# Patient Record
Sex: Female | Born: 1985 | Race: White | Hispanic: Yes | Marital: Married | Smoking: Never smoker
Health system: Southern US, Community
[De-identification: ages and names within clinical notes are randomized; demographics above are authoritative.]

## PROBLEM LIST (undated history)

## (undated) ENCOUNTER — Inpatient Hospital Stay (HOSPITAL_COMMUNITY): Payer: Self-pay

## (undated) DIAGNOSIS — D219 Benign neoplasm of connective and other soft tissue, unspecified: Secondary | ICD-10-CM

## (undated) DIAGNOSIS — Z789 Other specified health status: Secondary | ICD-10-CM

---
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
## 2004-09-15 ENCOUNTER — Emergency Department (HOSPITAL_COMMUNITY): Admission: EM | Admit: 2004-09-15 | Discharge: 2004-09-16 | Payer: Self-pay | Admitting: Emergency Medicine

## 2004-11-09 ENCOUNTER — Ambulatory Visit (HOSPITAL_COMMUNITY): Admission: RE | Admit: 2004-11-09 | Discharge: 2004-11-09 | Payer: Self-pay | Admitting: *Deleted

## 2005-02-15 ENCOUNTER — Ambulatory Visit: Payer: Self-pay | Admitting: *Deleted

## 2005-02-16 ENCOUNTER — Inpatient Hospital Stay (HOSPITAL_COMMUNITY): Admission: RE | Admit: 2005-02-16 | Discharge: 2005-02-20 | Payer: Self-pay | Admitting: Obstetrics & Gynecology

## 2005-02-16 ENCOUNTER — Encounter (INDEPENDENT_AMBULATORY_CARE_PROVIDER_SITE_OTHER): Payer: Self-pay | Admitting: *Deleted

## 2005-02-16 ENCOUNTER — Ambulatory Visit: Payer: Self-pay | Admitting: Certified Nurse Midwife

## 2007-08-08 ENCOUNTER — Ambulatory Visit: Payer: Self-pay | Admitting: Internal Medicine

## 2007-08-11 ENCOUNTER — Ambulatory Visit: Payer: Self-pay | Admitting: Internal Medicine

## 2007-08-12 ENCOUNTER — Ambulatory Visit: Payer: Self-pay | Admitting: Internal Medicine

## 2008-06-17 ENCOUNTER — Inpatient Hospital Stay (HOSPITAL_COMMUNITY): Admission: AD | Admit: 2008-06-17 | Discharge: 2008-06-17 | Payer: Self-pay | Admitting: Family Medicine

## 2008-07-15 ENCOUNTER — Ambulatory Visit (HOSPITAL_COMMUNITY): Admission: RE | Admit: 2008-07-15 | Discharge: 2008-07-15 | Payer: Self-pay | Admitting: Family Medicine

## 2008-08-03 ENCOUNTER — Ambulatory Visit (HOSPITAL_COMMUNITY): Admission: RE | Admit: 2008-08-03 | Discharge: 2008-08-03 | Payer: Self-pay | Admitting: Obstetrics & Gynecology

## 2008-12-24 ENCOUNTER — Inpatient Hospital Stay (HOSPITAL_COMMUNITY): Admission: AD | Admit: 2008-12-24 | Discharge: 2008-12-24 | Payer: Self-pay | Admitting: Obstetrics & Gynecology

## 2009-01-07 ENCOUNTER — Ambulatory Visit: Payer: Self-pay | Admitting: Obstetrics & Gynecology

## 2009-01-07 ENCOUNTER — Inpatient Hospital Stay (HOSPITAL_COMMUNITY): Admission: AD | Admit: 2009-01-07 | Discharge: 2009-01-09 | Payer: Self-pay | Admitting: Obstetrics & Gynecology

## 2009-01-07 ENCOUNTER — Ambulatory Visit: Payer: Self-pay | Admitting: Obstetrics and Gynecology

## 2009-01-30 IMAGING — US US OB COMP +14 WK
1 series · 14 of 28 positions shown · non-contrast
Comparison: none

OBSTETRICAL ULTRASOUND:
 This ultrasound exam was performed in the [HOSPITAL] Ultrasound Department.  The OB US report was generated in the AS system, and faxed to the ordering physician.  This report is also available in [REDACTED] PACS.

[Series 1: us ob comp +14 wk · 0.24mm/px · 14 of 37 slices shown]
[im 2/37]
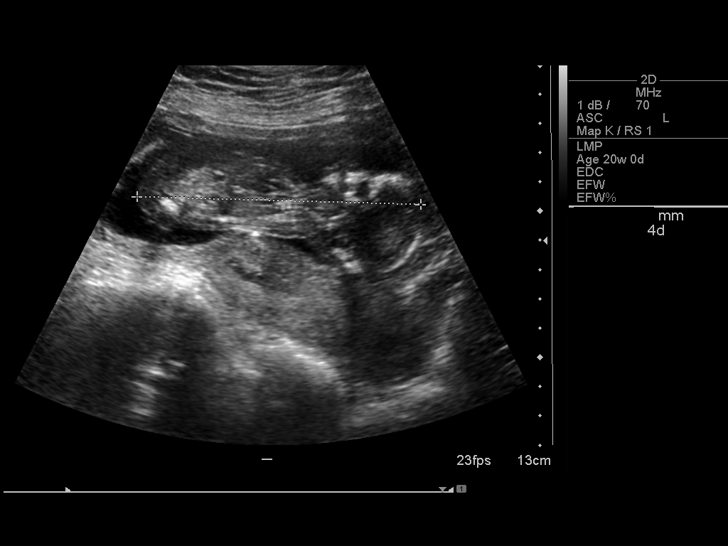
[im 5/37]
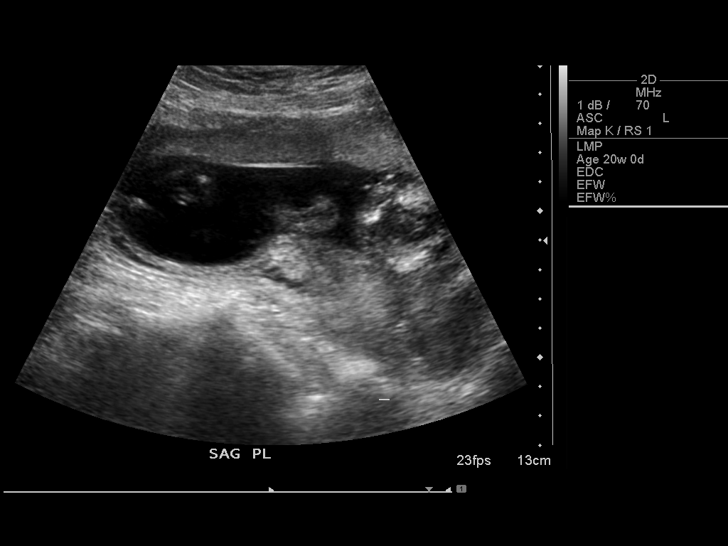
[im 7/37]
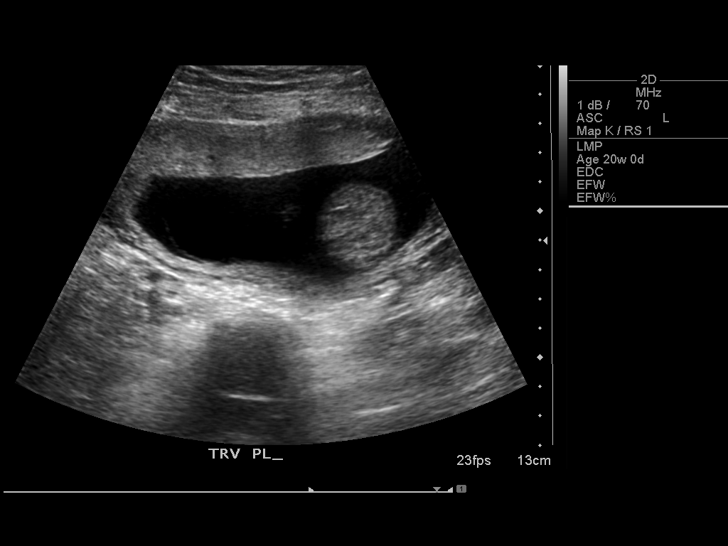
[im 10/37]
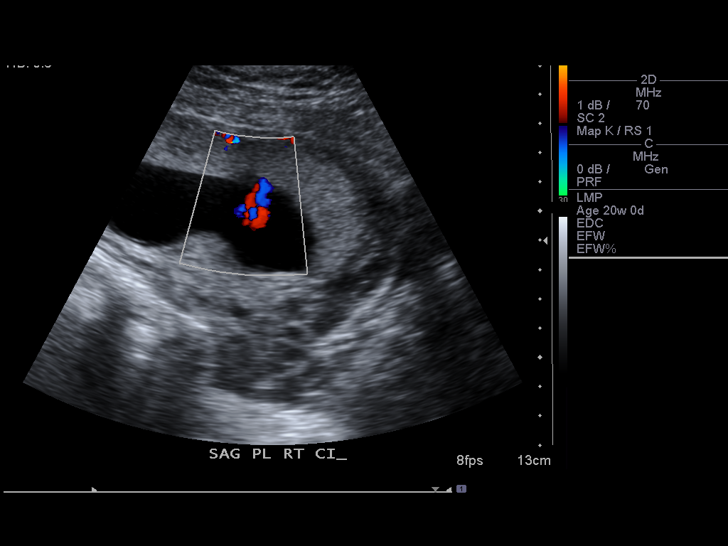
[im 13/37]
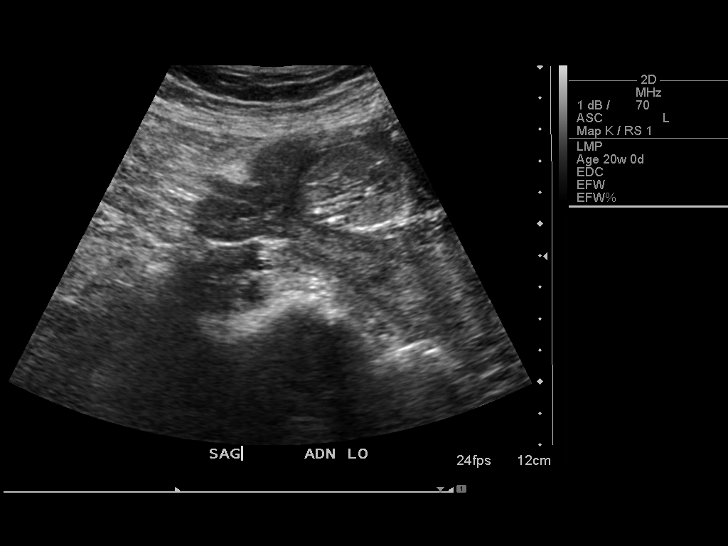
[im 15/37]
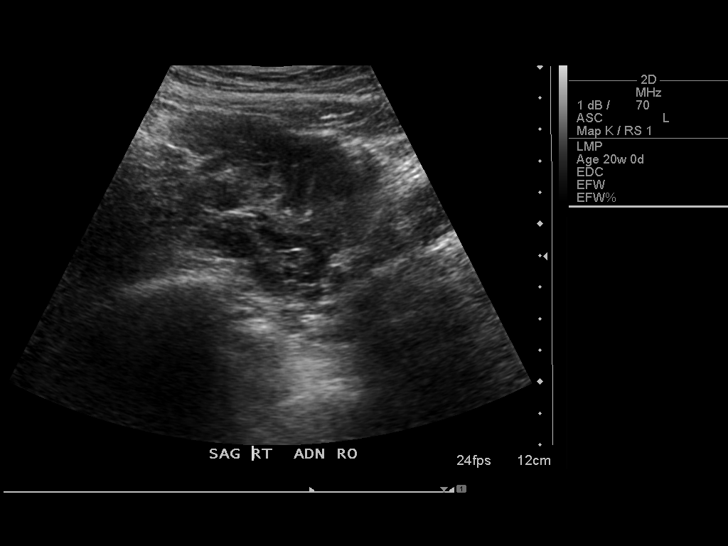
[im 18/37]
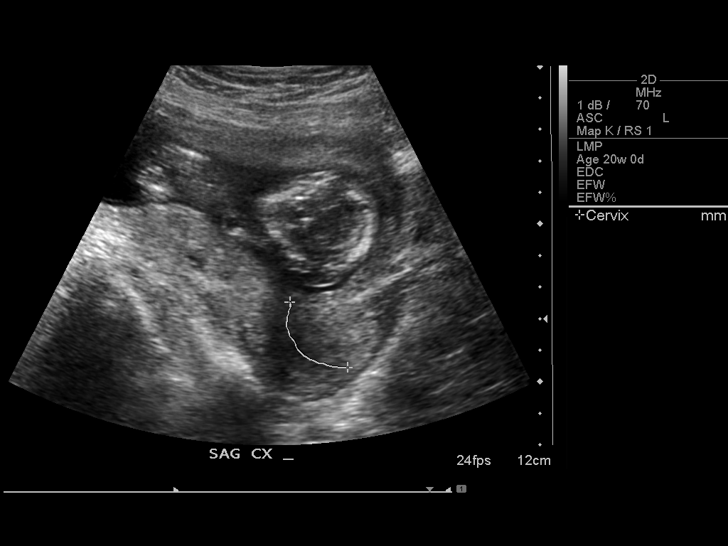
[im 21/37]
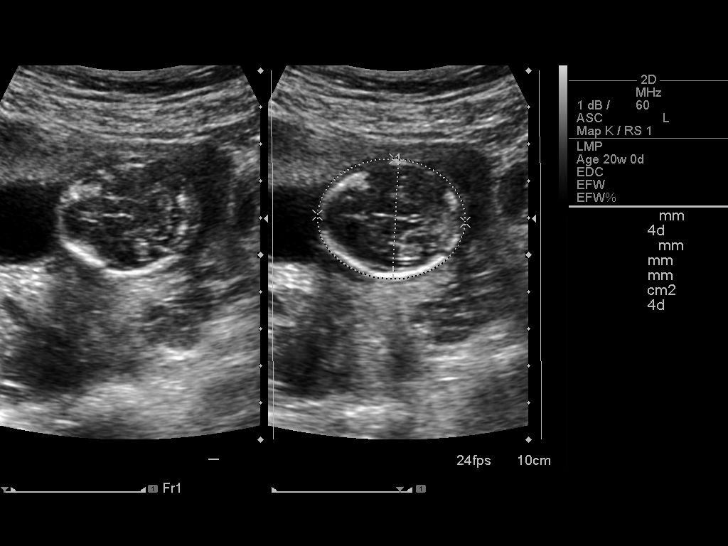
[im 23/37]
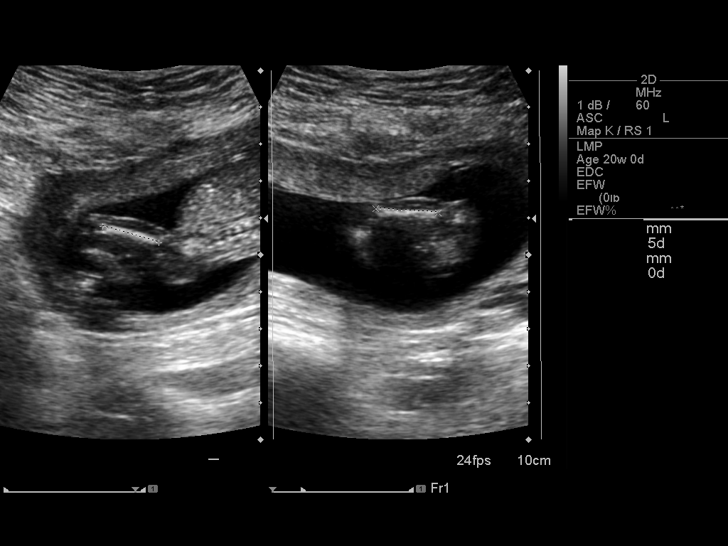
[im 26/37]
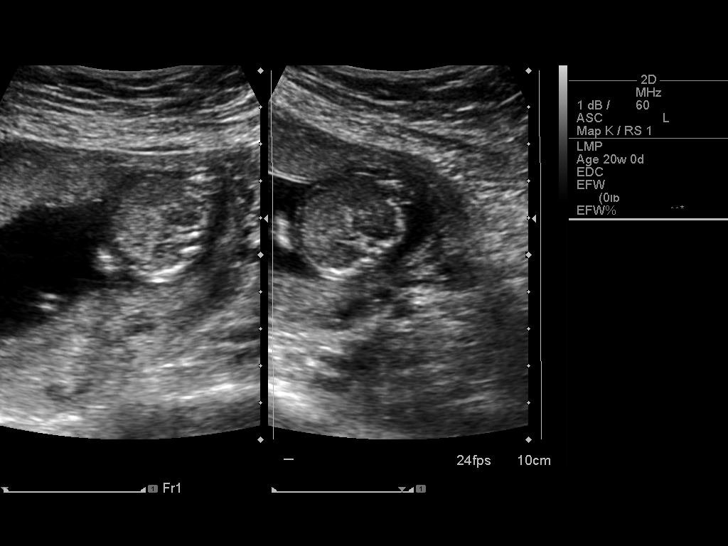
[im 29/37]
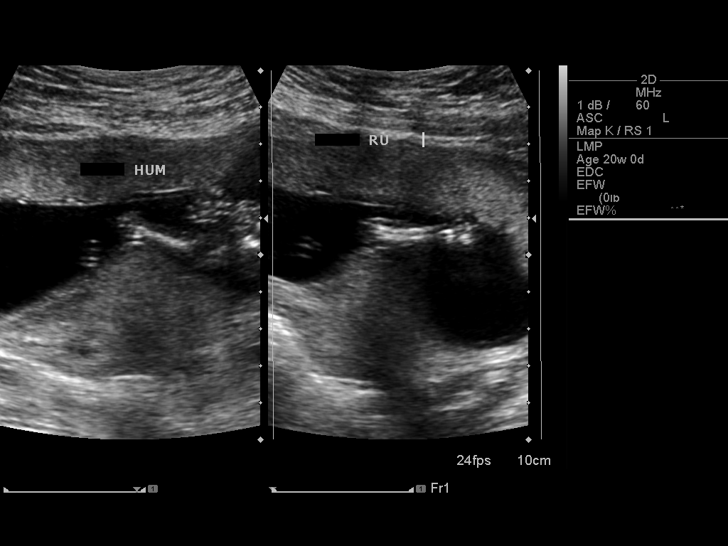
[im 31/37]
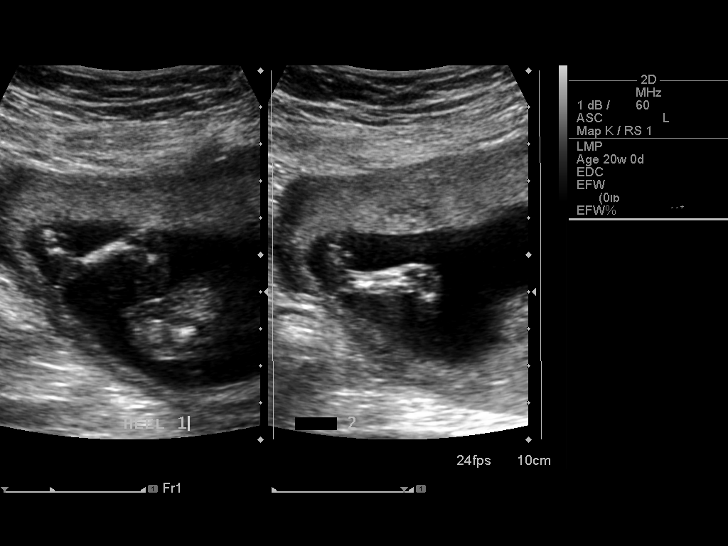
[im 34/37]
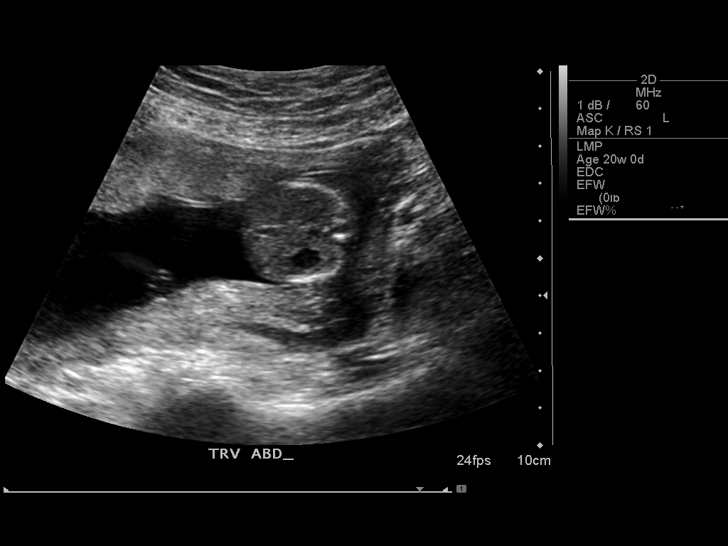
[im 37/37]
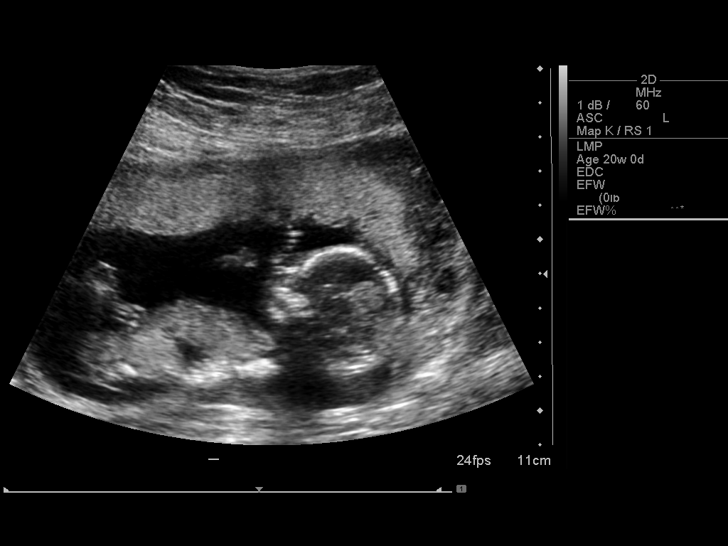

[14 of 28 positions shown; findings below may reference images not displayed]

IMPRESSION: See AS Obstetric US report.

## 2011-02-19 ENCOUNTER — Emergency Department (HOSPITAL_COMMUNITY)
Admission: EM | Admit: 2011-02-19 | Discharge: 2011-02-20 | Disposition: A | Discharge status: Home / Self Care | Payer: Self-pay | Attending: Emergency Medicine | Admitting: Emergency Medicine

## 2011-02-19 DIAGNOSIS — R05 Cough: Secondary | ICD-10-CM | POA: Insufficient documentation

## 2011-02-19 DIAGNOSIS — R059 Cough, unspecified: Secondary | ICD-10-CM | POA: Insufficient documentation

## 2011-02-19 DIAGNOSIS — R5381 Other malaise: Secondary | ICD-10-CM | POA: Insufficient documentation

## 2011-02-19 DIAGNOSIS — J3489 Other specified disorders of nose and nasal sinuses: Secondary | ICD-10-CM | POA: Insufficient documentation

## 2011-02-19 DIAGNOSIS — J029 Acute pharyngitis, unspecified: Secondary | ICD-10-CM | POA: Insufficient documentation

## 2011-02-19 DIAGNOSIS — J069 Acute upper respiratory infection, unspecified: Secondary | ICD-10-CM | POA: Insufficient documentation

## 2011-02-19 DIAGNOSIS — R51 Headache: Secondary | ICD-10-CM | POA: Insufficient documentation

## 2011-02-20 LAB — RAPID STREP SCREEN (MED CTR MEBANE ONLY): Streptococcus, Group A Screen (Direct): NEGATIVE

## 2011-03-06 LAB — RPR: RPR Ser Ql: NONREACTIVE

## 2011-03-06 LAB — CBC
HCT: 40.5 % (ref 36.0–46.0)
MCV: 95.2 fL (ref 78.0–100.0)
Platelets: 213 10*3/uL (ref 150–400)
RBC: 4.25 MIL/uL (ref 3.87–5.11)
WBC: 13.3 10*3/uL — ABNORMAL HIGH (ref 4.0–10.5)

## 2011-04-06 NOTE — Op Note (Signed)
NAME:  Tammy Rojas, Tammy Rojas NO.:  [id number]   MEDICAL RECORD NO.:  [id number]          PATIENT TYPE:  INP   LOCATION:  9106                          FACILITY:  [facility]   PHYSICIAN:  Lesly Dukes, M.D. DATE OF BIRTH:  Mar 20, 1986   DATE OF PROCEDURE:  02/16/2005  DATE OF DISCHARGE:                                 OPERATIVE REPORT   PREOPERATIVE DIAGNOSIS:  25 year old G1, P0 at term in breech presentation.   POSTOPERATIVE DIAGNOSIS:  25 year old G1, P0 at term in breech presentation.   PROCEDURE:  Primary low flap transverse cesarean section.   SURGEON:  Lesly Dukes, M.D.   ASSISTANT:  Shelbie Proctor. Shawnie Pons, M.D.   ANESTHESIA:  Spinal.   SPECIMENS:  Placenta.   ESTIMATED BLOOD LOSS:  800 mL.   COMPLICATIONS:  None.   DESCRIPTION OF PROCEDURE:  After informed consent was obtained, the patient  was taken to the operating room where spinal anesthesia was found to be  adequate.  The patient was placed in a dorsal supine position with a  leftward tilt and prepped and draped in the normal sterile fashion.  A Foley  was in the bladder.  A Pfannenstiel skin incision was made with the scalpel  and carried down to the underlying layer of fascia.  The fascia was incised  in the midline and this incision extended bilaterally with the Mayo  scissors.  The superior and inferior aspect of the fascial incision were  grasped with Kocher clamps, tented up, and dissected off sharply and bluntly  from the underlying layers of the rectus muscle which was separated in the  midline.  The peritoneum was identified, tented up, entered sharply with the  Metzenbaum scissors.  This incision was extended both superiorly and  inferiorly with good visualization of the bladder.  The bladder blade was  inserted.  The vesicouterine peritoneum was identified, tented up, and  entered sharply with the Metzenbaum scissors.  This incision was extended  bilaterally and the bladder flap was created  digitally.  The bladder blade  was reinserted.  The uterine incision was made in the lower uterine segment  in a transverse fashion with the scalpel.  This incision was extended  bilaterally bluntly.  The baby was in frank breech presentation and was  delivered buttocks first, the baby's body delivered easily and the head  delivered without problems.  There was a nuchal cord x 2 that was easily  reduced.  The cord was clamped and cut and the baby was handed off to the  awaiting pediatricians.  Cord blood was sent for type and screen.  The  placenta delivered spontaneously with three vessel cord.  The uterus was  cleared of all clots and debris.  The uterine incision was closed with 0  Vicryl in a running locked fashion, a second layer of 0 Vicryl was used to  reinforce the incision.  Good hemostasis was noted.  The gutters were  cleared of all clots and debris and the abdomen was irrigated.  The rectus  muscles and peritoneum were noted to be hemostatic.  The fascial  incisions  were then closed with 0 Vicryl in a running fashion.  Good hemostasis was  noted.  The subcutaneous tissue was copiously irrigated and found to be  hemostatic.  The skin was closed with staples.  A dressing was placed.  The  patient tolerated the procedure well.  Sponge, lap, instrument, and needle  counts were correct x 2.  The patient was sent to the recovery room in  stable condition.      KHL/MEDQ  D:  02/16/2005  T:  02/16/2005  Job:  [id number]

## 2011-04-06 NOTE — Discharge Summary (Signed)
NAME:  Tammy Rojas, Tammy Rojas              ACCOUNT NO.:  [id number]   MEDICAL RECORD NO.:  [id number]          PATIENT TYPE:  INP   LOCATION:  9106                          FACILITY:  [facility]   PHYSICIAN:  Jon Gills, M.D.  DATE OF BIRTH:  1986-05-20   DATE OF ADMISSION:  02/16/2005  DATE OF DISCHARGE:                                 DISCHARGE SUMMARY   ADMISSION DIAGNOSES:  1.  A 25 year old G1 with known breech presentation.  2.  Reassuring fetal heart tracing.   DISCHARGE DIAGNOSES:  1.  A 25 year old G1, P1, postoperative day #4 status post lower transverse      cesarean section for breech presentation.  2.  Endometritis with fevers postoperatively.  3.  Viable infant female.   DISCHARGE MEDICATIONS:  1.  Percocet 5/325 one to two p.o. q.4-6h. p.r.n., #30, no refill.  2.  Ibuprofen 600 mg one p.o. q.6h.  3.  Prenatal vitamin one p.o. daily x6 weeks.  4.  Ortho Novum 1/35, one p.o. daily as directed.   ADMISSION HISTORY:  Ms. Daphine Deutscher was admitted and taken to the OR for known  breech presentation.  Her postoperative course was complicated by fever.  She was started on gentamicin and clindamycin on postoperative day #1.  She  was continued on antibiotics until she was afebrile for 24 hours.  She  received a total of 2-1/2 days of antibiotics.  Both of her blood cultures  were no growth to date and her urine culture was negative.  Her  postoperative hemoglobin was 11.3.   CONDITION ON DISCHARGE:  The patient is discharged to home in stable  condition.   INSTRUCTIONS GIVEN TO PATIENT:  The patient was told of the above medical  regimen.  She [facility] 6-week postpartum  check.  She is being discharged with her infant.      LC/MEDQ  D:  02/20/2005  T:  02/20/2005  Job:  [id number]

## 2011-11-20 NOTE — L&D Delivery Note (Signed)
Delivery Note At 7:26 AM a viable and healthy female was delivered via  (Presentation: left occiput anterior;  ) around a nuchal cord x 1.  APGAR: pending, ; weight pending.   Placenta status: in tact, .  Cord:  with the following complications: none.   Anesthesia:  N/a  Episiotomy: none Lacerations: none Suture Repair: n/a Est. Blood Loss (mL): 200  Mom to postpartum.  Baby to nursery-stable.  [name] 07/30/2012, 7:51 AM  I have seen and examined this patient and I agree with the above. Cam Hai 8:11 AM 07/30/2012

## 2012-04-08 ENCOUNTER — Other Ambulatory Visit (HOSPITAL_COMMUNITY): Payer: Self-pay | Admitting: Nurse Practitioner

## 2012-04-08 DIAGNOSIS — Z3689 Encounter for other specified antenatal screening: Secondary | ICD-10-CM

## 2012-04-08 LAB — OB RESULTS CONSOLE RPR
RPR: NONREACTIVE
RPR: NONREACTIVE
RPR: NONREACTIVE
RPR: NONREACTIVE

## 2012-04-08 LAB — OB RESULTS CONSOLE GBS
GBS: NEGATIVE
GBS: NEGATIVE

## 2012-04-08 LAB — OB RESULTS CONSOLE HIV ANTIBODY (ROUTINE TESTING)
HIV: NONREACTIVE
HIV: NONREACTIVE
HIV: NONREACTIVE

## 2012-04-08 LAB — OB RESULTS CONSOLE ABO/RH: RH Type: POSITIVE

## 2012-04-09 ENCOUNTER — Encounter (HOSPITAL_COMMUNITY): Payer: Self-pay

## 2012-04-09 ENCOUNTER — Ambulatory Visit (HOSPITAL_COMMUNITY)
Admission: RE | Admit: 2012-04-09 | Discharge: 2012-04-09 | Disposition: A | Discharge status: Home / Self Care | Payer: Self-pay | Source: Ambulatory Visit | Attending: Nurse Practitioner | Admitting: Nurse Practitioner

## 2012-04-09 DIAGNOSIS — Z1389 Encounter for screening for other disorder: Secondary | ICD-10-CM | POA: Insufficient documentation

## 2012-04-09 DIAGNOSIS — Z363 Encounter for antenatal screening for malformations: Secondary | ICD-10-CM | POA: Insufficient documentation

## 2012-04-09 DIAGNOSIS — O358XX0 Maternal care for other (suspected) fetal abnormality and damage, not applicable or unspecified: Secondary | ICD-10-CM | POA: Insufficient documentation

## 2012-04-09 DIAGNOSIS — O34219 Maternal care for unspecified type scar from previous cesarean delivery: Secondary | ICD-10-CM | POA: Insufficient documentation

## 2012-04-09 DIAGNOSIS — Z3689 Encounter for other specified antenatal screening: Secondary | ICD-10-CM

## 2012-04-21 ENCOUNTER — Other Ambulatory Visit (HOSPITAL_COMMUNITY): Payer: Self-pay | Admitting: Family

## 2012-04-21 DIAGNOSIS — Z09 Encounter for follow-up examination after completed treatment for conditions other than malignant neoplasm: Secondary | ICD-10-CM

## 2012-04-28 ENCOUNTER — Ambulatory Visit (HOSPITAL_COMMUNITY): Payer: Self-pay

## 2012-05-12 ENCOUNTER — Ambulatory Visit (HOSPITAL_COMMUNITY)
Admission: RE | Admit: 2012-05-12 | Discharge: 2012-05-12 | Disposition: A | Discharge status: Home / Self Care | Payer: Medicaid Other | Source: Ambulatory Visit | Attending: Family | Admitting: Family

## 2012-05-12 DIAGNOSIS — Z3689 Encounter for other specified antenatal screening: Secondary | ICD-10-CM | POA: Insufficient documentation

## 2012-05-12 DIAGNOSIS — O34219 Maternal care for unspecified type scar from previous cesarean delivery: Secondary | ICD-10-CM | POA: Insufficient documentation

## 2012-05-12 DIAGNOSIS — Z09 Encounter for follow-up examination after completed treatment for conditions other than malignant neoplasm: Secondary | ICD-10-CM

## 2012-07-30 ENCOUNTER — Encounter (HOSPITAL_COMMUNITY): Payer: Self-pay

## 2012-07-30 ENCOUNTER — Inpatient Hospital Stay (HOSPITAL_COMMUNITY)
Admission: AD | Admit: 2012-07-30 | Discharge: 2012-07-31 | DRG: 775 | Disposition: A | Discharge status: Home / Self Care | Payer: Medicaid Other | Source: Ambulatory Visit | Attending: Family Medicine | Admitting: Family Medicine

## 2012-07-30 DIAGNOSIS — O34219 Maternal care for unspecified type scar from previous cesarean delivery: Secondary | ICD-10-CM

## 2012-07-30 HISTORY — DX: Other specified health status: Z78.9

## 2012-07-30 LAB — CBC
HCT: 42.5 % (ref 36.0–46.0)
Hemoglobin: 14.2 g/dL (ref 12.0–15.0)
MCH: 31.1 pg (ref 26.0–34.0)
RBC: 4.56 MIL/uL (ref 3.87–5.11)

## 2012-07-30 LAB — RPR: RPR Ser Ql: NONREACTIVE

## 2012-07-30 LAB — OB RESULTS CONSOLE GBS: GBS: NEGATIVE

## 2012-07-30 LAB — OB RESULTS CONSOLE GC/CHLAMYDIA: Chlamydia: NEGATIVE

## 2012-07-30 LAB — OB RESULTS CONSOLE RPR: RPR: NONREACTIVE

## 2012-07-30 MED ORDER — PRENATAL MULTIVITAMIN CH
1.0000 | ORAL_TABLET | Freq: Every day | ORAL | Status: DC
  Administered 2012-07-31: 1 via ORAL
  Filled 2012-07-30: qty 1

## 2012-07-30 MED ORDER — DIPHENHYDRAMINE HCL 25 MG PO CAPS: 25.0000 mg | ORAL_CAPSULE | Freq: Four times a day (QID) | ORAL | Status: DC | PRN

## 2012-07-30 MED ORDER — OXYTOCIN 40 UNITS IN LACTATED RINGERS INFUSION - SIMPLE MED
62.5000 mL/h | Freq: Once | INTRAVENOUS | Status: AC | PRN
  Administered 2012-07-30: 62.5 mL/h via INTRAVENOUS

## 2012-07-30 MED ORDER — WITCH HAZEL-GLYCERIN EX PADS: 1.0000 "application " | MEDICATED_PAD | CUTANEOUS | Status: DC | PRN

## 2012-07-30 MED ORDER — SENNOSIDES-DOCUSATE SODIUM 8.6-50 MG PO TABS: 2.0000 | ORAL_TABLET | Freq: Every day | ORAL | Status: DC

## 2012-07-30 MED ORDER — ONDANSETRON HCL 4 MG PO TABS: 4.0000 mg | ORAL_TABLET | ORAL | Status: DC | PRN

## 2012-07-30 MED ORDER — OXYCODONE-ACETAMINOPHEN 5-325 MG PO TABS
1.0000 | ORAL_TABLET | ORAL | Status: DC | PRN
  Administered 2012-07-30 (×2): 1 via ORAL
  Filled 2012-07-30 (×2): qty 1

## 2012-07-30 MED ORDER — LANOLIN HYDROUS EX OINT: TOPICAL_OINTMENT | CUTANEOUS | Status: DC | PRN

## 2012-07-30 MED ORDER — INFLUENZA VIRUS VACC SPLIT PF IM SUSP
0.5000 mL | INTRAMUSCULAR | Status: AC
  Administered 2012-07-31: 0.5 mL via INTRAMUSCULAR
  Filled 2012-07-30: qty 0.5

## 2012-07-30 MED ORDER — DIBUCAINE 1 % RE OINT: 1.0000 "application " | TOPICAL_OINTMENT | RECTAL | Status: DC | PRN

## 2012-07-30 MED ORDER — OXYTOCIN 10 UNIT/ML IJ SOLN
INTRAMUSCULAR | Status: AC
  Administered 2012-07-30: 10 [IU] via INTRAMUSCULAR
  Filled 2012-07-30: qty 1

## 2012-07-30 MED ORDER — BENZOCAINE-MENTHOL 20-0.5 % EX AERO: 1.0000 "application " | INHALATION_SPRAY | CUTANEOUS | Status: DC | PRN

## 2012-07-30 MED ORDER — ZOLPIDEM TARTRATE 5 MG PO TABS: 5.0000 mg | ORAL_TABLET | Freq: Every evening | ORAL | Status: DC | PRN

## 2012-07-30 MED ORDER — TETANUS-DIPHTH-ACELL PERTUSSIS 5-2.5-18.5 LF-MCG/0.5 IM SUSP
0.5000 mL | Freq: Once | INTRAMUSCULAR | Status: AC
  Administered 2012-07-31: 0.5 mL via INTRAMUSCULAR
  Filled 2012-07-30: qty 0.5

## 2012-07-30 MED ORDER — OXYTOCIN 40 UNITS IN LACTATED RINGERS INFUSION - SIMPLE MED
INTRAVENOUS | Status: AC
  Administered 2012-07-30: 62.5 mL/h via INTRAVENOUS
  Filled 2012-07-30: qty 1000

## 2012-07-30 MED ORDER — OXYTOCIN 10 UNIT/ML IJ SOLN
10.0000 [IU] | Freq: Once | INTRAMUSCULAR | Status: AC
  Administered 2012-07-30: 10 [IU] via INTRAMUSCULAR

## 2012-07-30 MED ORDER — ONDANSETRON HCL 4 MG/2ML IJ SOLN: 4.0000 mg | INTRAMUSCULAR | Status: DC | PRN

## 2012-07-30 MED ORDER — SIMETHICONE 80 MG PO CHEW: 80.0000 mg | CHEWABLE_TABLET | ORAL | Status: DC | PRN

## 2012-07-30 MED ORDER — IBUPROFEN 600 MG PO TABS
600.0000 mg | ORAL_TABLET | Freq: Four times a day (QID) | ORAL | Status: DC
  Administered 2012-07-30 – 2012-07-31 (×5): 600 mg via ORAL
  Filled 2012-07-30 (×5): qty 1

## 2012-07-30 NOTE — MAU Note (Signed)
Patient will be throughput to BS 167.

## 2012-07-30 NOTE — Progress Notes (Addendum)
RN walks into patients room. Family members had removed baby from skin-to-skin. Patient had been encouraged to do skin-to-skin with baby.

## 2012-07-30 NOTE — MAU Note (Signed)
States third baby and having frequent contractions and leaking fluid.

## 2012-07-30 NOTE — H&P (Signed)
Tammy Rojas is a 26 y.o. female presenting for labor.  Maternal Medical History:  Reason for admission: Reason for admission: contractions.  Contractions: Onset was 3-5 hours ago.   Frequency: regular.   Perceived severity is strong.    Fetal activity: Perceived fetal activity is normal.    Prenatal complications: No bleeding.   Prenatal Complications - Diabetes: none.    OB History    Grav Para Term Preterm Abortions TAB SAB Ect Mult Living   3 3 3  0 0 0 0 0 0 3     Past Medical History  Diagnosis Date  . No pertinent past medical history    Past Surgical History  Procedure Date  . Cesarean section    Family History: family history includes Diabetes in her father and Early death in her mother. Social History:  reports that she has never smoked. She has never used smokeless tobacco. She reports that she does not drink alcohol or use illicit drugs.   Prenatal Transfer Tool  Maternal Diabetes: No Genetic Screening: Declined Maternal Ultrasounds/Referrals: Normal Fetal Ultrasounds or other Referrals:  None Maternal Substance Abuse:  No Significant Maternal Medications:  None Significant Maternal Lab Results:  None Other Comments:  None  Review of Systems  All other systems reviewed and are negative.    Dilation: 10 Effacement (%): 100 Station: +1 Exam by:: Tammy Grill, RN Blood pressure 118/66, pulse 67, temperature 97.2 F (36.2 C), temperature source Oral, resp. rate 18, height 5' (1.524 m), weight 70.761 kg (156 lb), SpO2 100.00%, unknown if currently breastfeeding. Maternal Exam:  Uterine Assessment: Contraction strength is firm.  Abdomen: Patient reports no abdominal tenderness. Surgical scars: low transverse.   Fetal presentation: vertex     Fetal Exam Fetal Monitor Review: Mode: ultrasound.   Variability: moderate (6-25 bpm).   Pattern: accelerations present.    Fetal State Assessment: Category I - tracings are normal.     Physical Exam    Constitutional: She is oriented to person, place, and time. She appears well-developed and well-nourished.  Eyes: Conjunctivae normal are normal.  Neck: Neck supple.  Cardiovascular: Normal rate, regular rhythm and normal heart sounds.   Respiratory: Effort normal and breath sounds normal.  GI:       Gravid   Neurological: She is alert and oriented to person, place, and time.  Skin: Skin is warm and dry.  Dilation: 10 Dilation Complete Date: 07/30/12 Dilation Complete Time: 0719 Effacement (%): 100 Station: +1 Presentation: Vertex Exam by:: Tammy Grill, RN    Prenatal labs: ABO, Rh: O/Positive/-- (05/21 0000) Antibody: Negative (05/21 0000) Rubella: Immune (05/21 0000) RPR: Nonreactive (09/11 0000)  HBsAg: Negative (05/21 0000)  HIV: Non-reactive, Non-reactive, Non-reactive, Non-reactive, Non-reactive (05/21 0000)  GBS: Negative (09/11 0000)   Assessment/Plan: Tammy Rojas is a [id number] @[redacted]w[redacted]d  by Tammy Rojas [place] who presented in active labor and SVE complete and delivered precipitously upon arrival.  No complications at time of delivery. Pt had a c/x her first pregnancy and successful VBAC with her second.   1) routine orders 2) GBS negative 3) monitor for pp hemorrhage as pt is higher risk 4) IM pitocin as no IV access at time of delivery 5) transfer to mother baby once appropriate.  Tammy Rojas 07/30/2012, 10:06 AM  I have seen and examined this patient and I agree with the above. Tammy Rojas 11:23 PM 08/01/2012

## 2012-07-31 DIAGNOSIS — O34219 Maternal care for unspecified type scar from previous cesarean delivery: Secondary | ICD-10-CM

## 2012-07-31 MED ORDER — SENNOSIDES-DOCUSATE SODIUM 8.6-50 MG PO TABS: 2.0000 | ORAL_TABLET | Freq: Every day | ORAL | Status: AC

## 2012-07-31 NOTE — Discharge Summary (Signed)
Seen and agree with note. 

## 2012-07-31 NOTE — Progress Notes (Signed)
UR Chart review completed.  

## 2012-07-31 NOTE — Discharge Summary (Addendum)
Obstetric Discharge Summary  Postpartum day 1  Reason for Admission: onset of labor Prenatal Procedures: none Intrapartum Procedures: spontaneous vaginal delivery Postpartum Procedures: none Complications-Operative and Postpartum: none Hemoglobin  Date Value Range Status  07/30/2012 14.2  12.0 - 15.0 g/dL Final     HCT  Date Value Range Status  07/30/2012 42.5  36.0 - 46.0 % Final   Delivery Summary:  At 7:26 AM a viable and healthy female was delivered via  (Presentation: left occiput anterior;  ) around a nuchal cord x 1.  APGAR: pending, ; weight pending.   Placenta status: in tact, .  Cord:  with the following complications: none.   Anesthesia:  N/a  Episiotomy: none Lacerations: none Suture Repair: n/a Est. Blood Loss (mL): 200  Mom to postpartum.  Baby to nursery-stable.  Physical Exam:  General: alert, cooperative, appears stated age and no distress CV: 2+ bilateral DP pulses PULM: nl effort Lochia: appropriate Uterine Fundus: firm Incision: n/a DVT Evaluation: No evidence of DVT seen on physical exam. No significant calf/ankle edema.  Discharge Diagnoses: Term Pregnancy-delivered  Discharge Information: Date: 07/31/2012 Activity: pelvic rest Diet: routine Medications: PNV, Ibuprofen and Colace Condition: stable Instructions: refer to practice specific booklet Discharge to: home Follow-up Information    Follow up with [facility] [place]. Schedule an appointment as soon as possible for a visit in 6 weeks. Shawn Stall y haga una cita en 6 semanas para chequeo de 6 semanas)    Contact information:   [address] [place] [address] [phone]         Newborn Data: Live born female  Birth Weight: 6 lb 10.2 oz (3010 g) APGAR: 8, 9  Home with mother.  [name] 07/31/2012, 7:38 AM  Seen and agree Wynelle Bourgeois CNM

## 2012-08-05 NOTE — Discharge Summary (Signed)
Attestation of Attending Supervision of Advanced Practitioner (CNM/NP): Evaluation and management procedures were performed by the Advanced Practitioner under my supervision and collaboration.  I have reviewed the Advanced Practitioner's note and chart, and I agree with the management and plan.  UGONNA  ANYANWU, MD, FACOG Attending Obstetrician & Gynecologist Faculty Practice, [facility]  

## 2014-09-20 ENCOUNTER — Encounter (HOSPITAL_COMMUNITY): Payer: Self-pay

## 2016-11-19 ENCOUNTER — Encounter (HOSPITAL_COMMUNITY): Payer: Self-pay | Admitting: Emergency Medicine

## 2016-11-19 ENCOUNTER — Ambulatory Visit (HOSPITAL_COMMUNITY)
Admission: EM | Admit: 2016-11-19 | Discharge: 2016-11-19 | Disposition: A | Discharge status: Home / Self Care | Payer: Medicaid Other | Attending: Family Medicine | Admitting: Family Medicine

## 2016-11-19 DIAGNOSIS — L723 Sebaceous cyst: Secondary | ICD-10-CM | POA: Diagnosis not present

## 2016-11-19 DIAGNOSIS — IMO0002 Reserved for concepts with insufficient information to code with codable children: Secondary | ICD-10-CM

## 2016-11-19 MED ORDER — SULFAMETHOXAZOLE-TRIMETHOPRIM 800-160 MG PO TABS: 1.0000 | ORAL_TABLET | Freq: Two times a day (BID) | ORAL | 0 refills | Status: AC

## 2016-11-19 NOTE — Discharge Instructions (Signed)
Place warm compress on the back of the neck for 15 minutes at a time, 4 times a day. Should the rest become reddened, or inflamed, then return to clinic or follow up with primary care provider for drainage.

## 2016-11-19 NOTE — ED Triage Notes (Signed)
Here for a cyst/mass on back of neck/upper back onset 4-5 months associated w/pain  Denies fevers  A&O x4... NAD

## 2016-11-19 NOTE — ED Provider Notes (Signed)
CSN: [id number]     Arrival date & time 11/19/16  1729 History   None    Chief Complaint  Patient presents with  . Cyst   (Consider location/radiation/quality/duration/timing/severity/associated sxs/prior Treatment) 31 year old female presents to clinic with chief complaint of a cyst on the back of her neck that has been present for 6 months. Patient reports the cyst is painful and it hurts to touch or to move. She has had no systemic symptoms such as fever, N/V/D, chills, etc. She states it has not been red, and it has not grown in size.   The history is provided by the patient.    Past Medical History:  Diagnosis Date  . No pertinent past medical history    Past Surgical History:  Procedure Laterality Date  . CESAREAN SECTION     Family History  Problem Relation Age of Onset  . Early death Mother   . Diabetes Father    Social History  Substance Use Topics  . Smoking status: Never Smoker  . Smokeless tobacco: Never Used  . Alcohol use No   OB History    Gravida Para Term Preterm AB Living   3 3 3  0 0 3   SAB TAB Ectopic Multiple Live Births   0 0 0 0 1     Review of Systems  Constitutional: Negative.   HENT: Negative.   Eyes: Negative.   Respiratory: Negative.   Cardiovascular: Negative.   Gastrointestinal: Negative.   Skin:       Cyst on the back of the neck.    Allergies  Patient has no known allergies.  Home Medications   Prior to Admission medications   Medication Sig Start Date End Date Taking? Authorizing Provider  Prenatal Vit-Fe Fumarate-FA (PRENATAL MULTIVITAMIN) TABS Take 1 tablet by mouth daily.    Historical Provider, MD  sulfamethoxazole-trimethoprim (BACTRIM DS,SEPTRA DS) 800-160 MG tablet Take 1 tablet by mouth 2 (two) times daily. 11/19/16 11/26/16  Barnet Glasgow, NP   Meds Ordered and Administered this Visit  Medications - No data to display  BP 122/81 (BP Location: Left Arm)   Pulse 63   Temp 98 F (36.7 C) (Oral)   Resp 12   LMP  11/09/2016   SpO2 100%   Breastfeeding? No  No data found.   Physical Exam  Constitutional: She is oriented to person, place, and time. She appears well-developed and well-nourished.  HENT:  Head: Normocephalic.  Right Ear: External ear normal.  Left Ear: External ear normal.  Eyes: Pupils are equal, round, and reactive to light.  Neck: Normal range of motion. Neck supple. No JVD present.  Cardiovascular: Normal rate, regular rhythm and normal heart sounds.   Pulmonary/Chest: Effort normal and breath sounds normal.  Abdominal: Soft.  Lymphadenopathy:    She has no cervical adenopathy.  Neurological: She is alert and oriented to person, place, and time.  Skin: Skin is warm and dry. Capillary refill takes less than 2 seconds.     Psychiatric: She has a normal mood and affect.  Nursing note and vitals reviewed.   Urgent Care Course   Clinical Course     Procedures (including critical care time)  Labs Review Labs Reviewed - No data to display  Imaging Review No results found.   Visual Acuity Review  Right Eye Distance:   Left Eye Distance:   Bilateral Distance:    Right Eye Near:   Left Eye Near:    Bilateral Near:  MDM   1. Cyst of neck    The cyst does not yet appear infected or otherwise ready for incision and drainage. Patient given a prescription for bactrim DS and advised to place warm compresses on the site for 15 minutes at a time up to 4 times a day. Should it fail to improve or worsen, or if it comes to a head, follow up with a PCP or return to clinic     Barnet Glasgow, NP 11/19/16 1906

## 2018-06-07 ENCOUNTER — Emergency Department (HOSPITAL_COMMUNITY): Payer: Self-pay | Admitting: Anesthesiology

## 2018-06-07 ENCOUNTER — Encounter (HOSPITAL_COMMUNITY)
Admission: EM | Disposition: A | Discharge status: Home / Self Care | Payer: Self-pay | Source: Home / Self Care | Attending: Emergency Medicine

## 2018-06-07 ENCOUNTER — Encounter (HOSPITAL_COMMUNITY): Payer: Self-pay

## 2018-06-07 ENCOUNTER — Observation Stay (HOSPITAL_COMMUNITY)
Admission: EM | Admit: 2018-06-07 | Discharge: 2018-06-08 | Disposition: A | Discharge status: Home / Self Care | Payer: Self-pay | Attending: General Surgery | Admitting: General Surgery

## 2018-06-07 ENCOUNTER — Emergency Department (HOSPITAL_COMMUNITY): Payer: Self-pay

## 2018-06-07 DIAGNOSIS — K3580 Unspecified acute appendicitis: Principal | ICD-10-CM | POA: Diagnosis present

## 2018-06-07 DIAGNOSIS — K3589 Other acute appendicitis without perforation or gangrene: Secondary | ICD-10-CM

## 2018-06-07 HISTORY — PX: LAPAROSCOPIC APPENDECTOMY: SHX408

## 2018-06-07 LAB — COMPREHENSIVE METABOLIC PANEL
ALK PHOS: 87 U/L (ref 38–126)
ALT: 31 U/L (ref 0–44)
ANION GAP: 10 (ref 5–15)
AST: 25 U/L (ref 15–41)
Albumin: 4.3 g/dL (ref 3.5–5.0)
BUN: 11 mg/dL (ref 6–20)
CALCIUM: 9.1 mg/dL (ref 8.9–10.3)
CO2: 20 mmol/L — AB (ref 22–32)
Chloride: 107 mmol/L (ref 98–111)
Creatinine, Ser: 0.63 mg/dL (ref 0.44–1.00)
GFR calc non Af Amer: >60 mL/min (ref >60)
Glucose, Bld: 122 mg/dL — ABNORMAL HIGH (ref 70–99)
POTASSIUM: 3.7 mmol/L (ref 3.5–5.1)
SODIUM: 137 mmol/L (ref 135–145)
Total Bilirubin: 0.7 mg/dL (ref 0.3–1.2)
Total Protein: 7.4 g/dL (ref 6.5–8.1)

## 2018-06-07 LAB — CBC
HCT: 41.1 % (ref 36.0–46.0)
HEMOGLOBIN: 13.7 g/dL (ref 12.0–15.0)
MCH: 30.6 pg (ref 26.0–34.0)
MCHC: 33.3 g/dL (ref 30.0–36.0)
MCV: 91.9 fL (ref 78.0–100.0)
Platelets: 303 10*3/uL (ref 150–400)
RBC: 4.47 MIL/uL (ref 3.87–5.11)
RDW: 11.8 % (ref 11.5–15.5)
WBC: 15.9 10*3/uL — ABNORMAL HIGH (ref 4.0–10.5)

## 2018-06-07 LAB — URINALYSIS, ROUTINE W REFLEX MICROSCOPIC
Bilirubin Urine: NEGATIVE
GLUCOSE, UA: NEGATIVE mg/dL
Ketones, ur: NEGATIVE mg/dL
NITRITE: NEGATIVE
PROTEIN: NEGATIVE mg/dL
Specific Gravity, Urine: 1.034 — ABNORMAL HIGH (ref 1.005–1.030)
pH: 5 (ref 5.0–8.0)

## 2018-06-07 LAB — I-STAT BETA HCG BLOOD, ED (MC, WL, AP ONLY): I-stat hCG, quantitative: <5 m[IU]/mL (ref <5)

## 2018-06-07 LAB — LIPASE, BLOOD: LIPASE: 39 U/L (ref 11–51)

## 2018-06-07 SURGERY — APPENDECTOMY, LAPAROSCOPIC
Anesthesia: General | Site: Abdomen

## 2018-06-07 MED ORDER — ONDANSETRON HCL 4 MG/2ML IJ SOLN
INTRAMUSCULAR | Status: AC
Start: 2018-06-07 — End: 2018-06-07
  Filled 2018-06-07: qty 2

## 2018-06-07 MED ORDER — SODIUM CHLORIDE 0.9 % IV SOLN
2.0000 g | INTRAVENOUS | Status: DC
  Filled 2018-06-07: qty 20

## 2018-06-07 MED ORDER — PROPOFOL 10 MG/ML IV BOLUS
INTRAVENOUS | Status: AC
  Filled 2018-06-07: qty 20

## 2018-06-07 MED ORDER — TRAMADOL HCL 50 MG PO TABS
50.0000 mg | ORAL_TABLET | Freq: Four times a day (QID) | ORAL | Status: DC
  Administered 2018-06-07 – 2018-06-08 (×2): 50 mg via ORAL
  Filled 2018-06-07 (×2): qty 1

## 2018-06-07 MED ORDER — HYDROMORPHONE HCL 1 MG/ML IJ SOLN
0.5000 mg | Freq: Once | INTRAMUSCULAR | Status: AC
  Administered 2018-06-07: 0.5 mg via INTRAVENOUS
  Filled 2018-06-07: qty 1

## 2018-06-07 MED ORDER — GABAPENTIN 300 MG PO CAPS
300.0000 mg | ORAL_CAPSULE | Freq: Two times a day (BID) | ORAL | Status: DC
  Administered 2018-06-07 – 2018-06-08 (×2): 300 mg via ORAL
  Filled 2018-06-07 (×2): qty 1

## 2018-06-07 MED ORDER — OXYCODONE HCL 5 MG/5ML PO SOLN: 5.0000 mg | Freq: Once | ORAL | Status: DC | PRN

## 2018-06-07 MED ORDER — PROPOFOL 10 MG/ML IV BOLUS
INTRAVENOUS | Status: DC | PRN
  Administered 2018-06-07: 100 mg via INTRAVENOUS

## 2018-06-07 MED ORDER — HYDROMORPHONE HCL 1 MG/ML IJ SOLN: 0.5000 mg | INTRAMUSCULAR | Status: DC | PRN

## 2018-06-07 MED ORDER — ENOXAPARIN SODIUM 40 MG/0.4ML ~~LOC~~ SOLN: 40.0000 mg | SUBCUTANEOUS | Status: DC

## 2018-06-07 MED ORDER — DEXAMETHASONE SODIUM PHOSPHATE 10 MG/ML IJ SOLN
INTRAMUSCULAR | Status: AC
  Filled 2018-06-07: qty 1

## 2018-06-07 MED ORDER — HYDROMORPHONE HCL 1 MG/ML IJ SOLN: 0.5000 mg | Freq: Once | INTRAMUSCULAR | Status: DC

## 2018-06-07 MED ORDER — LIDOCAINE 2% (20 MG/ML) 5 ML SYRINGE
INTRAMUSCULAR | Status: AC
  Filled 2018-06-07: qty 5

## 2018-06-07 MED ORDER — ONDANSETRON HCL 4 MG/2ML IJ SOLN
4.0000 mg | Freq: Once | INTRAMUSCULAR | Status: AC
  Administered 2018-06-07: 4 mg via INTRAVENOUS
  Filled 2018-06-07: qty 2

## 2018-06-07 MED ORDER — BUPIVACAINE-EPINEPHRINE (PF) 0.25% -1:200000 IJ SOLN
INTRAMUSCULAR | Status: AC
  Filled 2018-06-07: qty 30

## 2018-06-07 MED ORDER — ONDANSETRON 4 MG PO TBDP
4.0000 mg | ORAL_TABLET | Freq: Once | ORAL | Status: AC | PRN
Start: 2018-06-07 — End: 2018-06-07
  Administered 2018-06-07: 4 mg via ORAL
  Filled 2018-06-07: qty 1

## 2018-06-07 MED ORDER — MIDAZOLAM HCL 2 MG/2ML IJ SOLN
INTRAMUSCULAR | Status: AC
  Filled 2018-06-07: qty 2

## 2018-06-07 MED ORDER — PROMETHAZINE HCL 25 MG/ML IJ SOLN
INTRAMUSCULAR | Status: AC
  Filled 2018-06-07: qty 1

## 2018-06-07 MED ORDER — FENTANYL CITRATE (PF) 250 MCG/5ML IJ SOLN
INTRAMUSCULAR | Status: DC | PRN
  Administered 2018-06-07: 100 ug via INTRAVENOUS

## 2018-06-07 MED ORDER — CEFTRIAXONE SODIUM 2 G IJ SOLR
2.0000 g | Freq: Once | INTRAMUSCULAR | Status: AC
  Administered 2018-06-07: 2 g via INTRAVENOUS
  Filled 2018-06-07: qty 20

## 2018-06-07 MED ORDER — SODIUM CHLORIDE 0.9 % IR SOLN
Status: DC | PRN
  Administered 2018-06-07: 1000 mL

## 2018-06-07 MED ORDER — PROMETHAZINE HCL 25 MG/ML IJ SOLN
6.2500 mg | INTRAMUSCULAR | Status: DC | PRN
  Administered 2018-06-07: 12.5 mg via INTRAVENOUS

## 2018-06-07 MED ORDER — LACTATED RINGERS IV SOLN
INTRAVENOUS | Status: DC | PRN
  Administered 2018-06-07: 16:00:00 via INTRAVENOUS

## 2018-06-07 MED ORDER — ADULT MULTIVITAMIN W/MINERALS CH
1.0000 | ORAL_TABLET | Freq: Every day | ORAL | Status: DC
  Administered 2018-06-07 – 2018-06-08 (×2): 1 via ORAL
  Filled 2018-06-07 (×4): qty 1

## 2018-06-07 MED ORDER — SODIUM CHLORIDE 0.9 % IV BOLUS
1000.0000 mL | Freq: Once | INTRAVENOUS | Status: AC
  Administered 2018-06-07: 1000 mL via INTRAVENOUS

## 2018-06-07 MED ORDER — KCL IN DEXTROSE-NACL 20-5-0.45 MEQ/L-%-% IV SOLN
INTRAVENOUS | Status: DC
  Administered 2018-06-07 – 2018-06-08 (×2): via INTRAVENOUS
  Filled 2018-06-07 (×2): qty 1000

## 2018-06-07 MED ORDER — IOHEXOL 300 MG/ML  SOLN
100.0000 mL | Freq: Once | INTRAMUSCULAR | Status: AC | PRN
  Administered 2018-06-07: 100 mL via INTRAVENOUS

## 2018-06-07 MED ORDER — ACETAMINOPHEN 500 MG PO TABS
1000.0000 mg | ORAL_TABLET | Freq: Four times a day (QID) | ORAL | Status: DC
  Administered 2018-06-07 – 2018-06-08 (×2): 1000 mg via ORAL
  Filled 2018-06-07 (×2): qty 2

## 2018-06-07 MED ORDER — FENTANYL CITRATE (PF) 100 MCG/2ML IJ SOLN: 25.0000 ug | INTRAMUSCULAR | Status: DC | PRN

## 2018-06-07 MED ORDER — DEXAMETHASONE SODIUM PHOSPHATE 10 MG/ML IJ SOLN
INTRAMUSCULAR | Status: DC | PRN
  Administered 2018-06-07: 10 mg via INTRAVENOUS

## 2018-06-07 MED ORDER — OXYCODONE HCL 5 MG PO TABS
5.0000 mg | ORAL_TABLET | ORAL | Status: DC | PRN
  Administered 2018-06-08 (×2): 5 mg via ORAL
  Filled 2018-06-07 (×2): qty 1

## 2018-06-07 MED ORDER — 0.9 % SODIUM CHLORIDE (POUR BTL) OPTIME
TOPICAL | Status: DC | PRN
  Administered 2018-06-07: 1000 mL

## 2018-06-07 MED ORDER — SODIUM CHLORIDE 0.9 % IV BOLUS (SEPSIS)
1000.0000 mL | Freq: Once | INTRAVENOUS | Status: AC
  Administered 2018-06-07: 1000 mL via INTRAVENOUS

## 2018-06-07 MED ORDER — LIDOCAINE 2% (20 MG/ML) 5 ML SYRINGE
INTRAMUSCULAR | Status: DC | PRN
  Administered 2018-06-07: 60 mg via INTRAVENOUS

## 2018-06-07 MED ORDER — BUPIVACAINE-EPINEPHRINE 0.25% -1:200000 IJ SOLN
INTRAMUSCULAR | Status: DC | PRN
  Administered 2018-06-07: 9 mL

## 2018-06-07 MED ORDER — ROCURONIUM BROMIDE 10 MG/ML (PF) SYRINGE
PREFILLED_SYRINGE | INTRAVENOUS | Status: DC | PRN
  Administered 2018-06-07: 50 mg via INTRAVENOUS

## 2018-06-07 MED ORDER — ROCURONIUM BROMIDE 10 MG/ML (PF) SYRINGE
PREFILLED_SYRINGE | INTRAVENOUS | Status: AC
  Filled 2018-06-07: qty 10

## 2018-06-07 MED ORDER — SUGAMMADEX SODIUM 200 MG/2ML IV SOLN
INTRAVENOUS | Status: DC | PRN
  Administered 2018-06-07: 200 mg via INTRAVENOUS

## 2018-06-07 MED ORDER — OXYCODONE HCL 5 MG PO TABS: 5.0000 mg | ORAL_TABLET | Freq: Once | ORAL | Status: DC | PRN

## 2018-06-07 MED ORDER — ONDANSETRON HCL 4 MG/2ML IJ SOLN
INTRAMUSCULAR | Status: DC | PRN
  Administered 2018-06-07: 4 mg via INTRAVENOUS

## 2018-06-07 MED ORDER — FENTANYL CITRATE (PF) 250 MCG/5ML IJ SOLN
INTRAMUSCULAR | Status: AC
  Filled 2018-06-07: qty 5

## 2018-06-07 MED ORDER — CELECOXIB 200 MG PO CAPS
200.0000 mg | ORAL_CAPSULE | Freq: Two times a day (BID) | ORAL | Status: DC
  Administered 2018-06-07 – 2018-06-08 (×2): 200 mg via ORAL
  Filled 2018-06-07 (×2): qty 1

## 2018-06-07 MED ORDER — METRONIDAZOLE IN NACL 5-0.79 MG/ML-% IV SOLN
500.0000 mg | Freq: Once | INTRAVENOUS | Status: AC
  Administered 2018-06-07: 500 mg via INTRAVENOUS
  Filled 2018-06-07: qty 100

## 2018-06-07 MED ORDER — SODIUM CHLORIDE 0.9 % IV SOLN: 1000.0000 mL | INTRAVENOUS | Status: DC

## 2018-06-07 MED ORDER — MIDAZOLAM HCL 2 MG/2ML IJ SOLN
INTRAMUSCULAR | Status: DC | PRN
  Administered 2018-06-07: 2 mg via INTRAVENOUS

## 2018-06-07 SURGICAL SUPPLY — 43 items
ADH SKN CLS APL DERMABOND .7 (GAUZE/BANDAGES/DRESSINGS) ×1
APPLIER CLIP ROT 10 11.4 M/L (STAPLE)
APR CLP MED LRG 11.4X10 (STAPLE)
BAG SPEC RTRVL LRG 6X4 10 (ENDOMECHANICALS) ×1
CANISTER SUCT 3000ML PPV (MISCELLANEOUS) ×3 IMPLANT
CHLORAPREP W/TINT 26ML (MISCELLANEOUS) ×3 IMPLANT
CLIP APPLIE ROT 10 11.4 M/L (STAPLE) IMPLANT
CLOSURE WOUND 1/2 X4 (GAUZE/BANDAGES/DRESSINGS) ×1
COVER SURGICAL LIGHT HANDLE (MISCELLANEOUS) ×3 IMPLANT
CUTTER FLEX LINEAR 45M (STAPLE) ×3 IMPLANT
DECANTER SPIKE VIAL GLASS SM (MISCELLANEOUS) ×2 IMPLANT
DERMABOND ADVANCED (GAUZE/BANDAGES/DRESSINGS) ×2
DERMABOND ADVANCED .7 DNX12 (GAUZE/BANDAGES/DRESSINGS) ×1 IMPLANT
DRSG TEGADERM 2-3/8X2-3/4 SM (GAUZE/BANDAGES/DRESSINGS) ×9 IMPLANT
ELECT REM PT RETURN 9FT ADLT (ELECTROSURGICAL) ×3
ELECTRODE REM PT RTRN 9FT ADLT (ELECTROSURGICAL) ×1 IMPLANT
GLOVE BIOGEL PI IND STRL 8 (GLOVE) ×1 IMPLANT
GLOVE BIOGEL PI INDICATOR 8 (GLOVE) ×2
GLOVE ECLIPSE 7.5 STRL STRAW (GLOVE) ×3 IMPLANT
GOWN STRL REUS W/ TWL LRG LVL3 (GOWN DISPOSABLE) ×3 IMPLANT
GOWN STRL REUS W/TWL LRG LVL3 (GOWN DISPOSABLE) ×6
KIT BASIN OR (CUSTOM PROCEDURE TRAY) ×3 IMPLANT
KIT TURNOVER KIT B (KITS) ×3 IMPLANT
NS IRRIG 1000ML POUR BTL (IV SOLUTION) ×3 IMPLANT
PAD ARMBOARD 7.5X6 YLW CONV (MISCELLANEOUS) ×6 IMPLANT
POUCH SPECIMEN RETRIEVAL 10MM (ENDOMECHANICALS) ×3 IMPLANT
RELOAD 45 VASCULAR/THIN (ENDOMECHANICALS) IMPLANT
RELOAD STAPLE 45 2.5 WHT GRN (ENDOMECHANICALS) IMPLANT
RELOAD STAPLE 45 3.5 BLU ETS (ENDOMECHANICALS) IMPLANT
RELOAD STAPLE TA45 3.5 REG BLU (ENDOMECHANICALS) ×3 IMPLANT
SET IRRIG TUBING LAPAROSCOPIC (IRRIGATION / IRRIGATOR) ×3 IMPLANT
SHEARS HARMONIC ACE PLUS 36CM (ENDOMECHANICALS) ×3 IMPLANT
SLEEVE ENDOPATH XCEL 5M (ENDOMECHANICALS) ×3 IMPLANT
SPECIMEN JAR SMALL (MISCELLANEOUS) ×3 IMPLANT
STRIP CLOSURE SKIN 1/2X4 (GAUZE/BANDAGES/DRESSINGS) ×2 IMPLANT
SUT MNCRL AB 4-0 PS2 18 (SUTURE) ×3 IMPLANT
TOWEL OR 17X26 10 PK STRL BLUE (TOWEL DISPOSABLE) ×3 IMPLANT
TRAY FOLEY MTR SLVR 14FR STAT (SET/KITS/TRAYS/PACK) ×2 IMPLANT
TRAY LAPAROSCOPIC MC (CUSTOM PROCEDURE TRAY) ×3 IMPLANT
TROCAR XCEL BLUNT TIP 100MML (ENDOMECHANICALS) ×3 IMPLANT
TROCAR XCEL NON-BLD 5MMX100MML (ENDOMECHANICALS) ×3 IMPLANT
TUBING INSUFFLATION (TUBING) ×3 IMPLANT
WATER STERILE IRR 1000ML POUR (IV SOLUTION) ×3 IMPLANT

## 2018-06-07 NOTE — Anesthesia Preprocedure Evaluation (Signed)
Anesthesia Evaluation  Patient identified by MRN, date of birth, ID band Patient awake    Reviewed: Allergy & Precautions, NPO status , Patient's Chart, lab work & pertinent test results  History of Anesthesia Complications Negative for: history of anesthetic complications  Airway Mallampati: II  TM Distance: >3 FB Neck ROM: Full    Dental  (+) Teeth Intact   Pulmonary neg pulmonary ROS,    breath sounds clear to auscultation       Cardiovascular negative cardio ROS   Rhythm:Regular     Neuro/Psych negative neurological ROS  negative psych ROS   GI/Hepatic Neg liver ROS, Acute appendicitis   Endo/Other  Morbid obesity  Renal/GU negative Renal ROS     Musculoskeletal negative musculoskeletal ROS (+)   Abdominal   Peds  Hematology negative hematology ROS (+)   Anesthesia Other Findings   Reproductive/Obstetrics                             Anesthesia Physical Anesthesia Plan  ASA: II  Anesthesia Plan: General   Post-op Pain Management:    Induction: Intravenous, Rapid sequence and Cricoid pressure planned  PONV Risk Score and Plan: 3 and Ondansetron and Dexamethasone  Airway Management Planned: Oral ETT  Additional Equipment: None  Intra-op Plan:   Post-operative Plan: Extubation in OR  Informed Consent: I have reviewed the patients History and Physical, chart, labs and discussed the procedure including the risks, benefits and alternatives for the proposed anesthesia with the patient or authorized representative who has indicated his/her understanding and acceptance.   Dental advisory given  Plan Discussed with: CRNA and Surgeon  Anesthesia Plan Comments:         Anesthesia Quick Evaluation

## 2018-06-07 NOTE — Op Note (Signed)
OPERATIVE REPORT  DATE OF OPERATION: 06/07/2018  PATIENT:  Tammy Rojas  32 y.o. female  PRE-OPERATIVE DIAGNOSIS:  Acute Appendicitis  POST-OPERATIVE DIAGNOSIS:  Acute Appendicitis  INDICATION(S) FOR OPERATION:  Abdominal pain and nausea with CT demonstrating acute appendicitis  FINDINGS:  Acute suppurative appendicitis  PROCEDURE:  Procedure(s): APPENDECTOMY LAPAROSCOPIC  SURGEON:  Surgeon(s): Judeth Horn, MD  ASSISTANT: None  ANESTHESIA:   general  COMPLICATIONS:  None  EBL: <10 ml  BLOOD ADMINISTERED: none  DRAINS: none   SPECIMEN:  Source of Specimen:  Appendix  COUNTS CORRECT:  YES  PROCEDURE DETAILS: The patient was taken to the operating room and placed on the table in the supine position.  After an adequate general endotracheal anesthetic was administered, she was prepped and draped in the usual sterile manner exposing her abdomen.  A proper timeout was performed identifying the patient and the procedure to be performed.  We started with a supraumbilical incision down to the midline fascia.  We incised the fascia using a #15 blade and bluntly dissected down into the peritoneal cavity using a Kelly clamp.  We then passed a pursestring suture of 0 Vicryl around the fascial opening and subsequently a Hassan cannula through which carbon dioxide gas was insufflated up to a maximal intra-abdominal pressure of 15 mmHg.  We subsequently passed a right upper quadrant 5 mm cannula in the left lower quadrant 5 mm cannula under direct vision.  Patient had some adhesions of omentum to her anterior abdominal wall at her previous C-section site and this was taken down using the harmonic scalpel.  The appendix was visualized and subsequently mobilized demonstrating evidence of acute appendicitis with a and exudative substance undersurface with no evidence of perforation.  We dissected out the mesoappendix using the harmonic scalpel and skeletonizing the appendiceal  vasculature down to the base.  It was at that point had an Endo GIA was passed across the base of the appendix at the cecum using a blue cartridge.  We detach the appendix and subsequently retreated from the peritoneal cavity using an Endo Catch bag.  We inspected the area of dissection and removal of the appendix with bleeding and there was no bleeding.  The staple line looked intact.  We subsequently aspirated all fluid and gas from the peritoneal cavity as a close off the fascia at the supraumbilical site using the pursestring suture which was in place.  Once this was done we injected 0.25% Marcaine at all sites.  We closed the supraumbilical wound skin using a running subcuticular stitch of 4-0 Monocryl.  The other sites were closed with Dermabond Steri-Strips and Tegaderm all needle counts, sponge counts, and instrument counts were correct.  PATIENT DISPOSITION:  PACU - hemodynamically stable.   Judeth Horn 7/20/20195:32 PM

## 2018-06-07 NOTE — H&P (Signed)
Tammy Rojas is an 32 y.o. female.   Chief Complaint: Abdominal pain HPI: Started with right sided abdominal pain about 5:00AM.  Worsend and she came to the ED where a CT scan confirmed the presence of acute appendicitis.  Past Medical History:  Diagnosis Date  . No pertinent past medical history     Past Surgical History:  Procedure Laterality Date  . CESAREAN SECTION      Family History  Problem Relation Age of Onset  . Early death Mother   . Diabetes Father    Social History:  reports that she has never smoked. She has never used smokeless tobacco. She reports that she does not drink alcohol or use drugs.  Allergies: No Known Allergies   (Not in a hospital admission)  Results for orders placed or performed during the hospital encounter of 06/07/18 (from the past 48 hour(s))  Lipase, blood     Status: None   Collection Time: 06/07/18 11:35 AM  Result Value Ref Range   Lipase 39 11 - 51 U/L    Comment: Performed at [facility], [address], [place], [place] [address]  Comprehensive metabolic panel     Status: Abnormal   Collection Time: 06/07/18 11:35 AM  Result Value Ref Range   Sodium 137 135 - 145 mmol/L   Potassium 3.7 3.5 - 5.1 mmol/L   Chloride 107 98 - 111 mmol/L    Comment: Please note change in reference range.   CO2 20 (L) 22 - 32 mmol/L   Glucose, Bld 122 (H) 70 - 99 mg/dL    Comment: Please note change in reference range.   BUN 11 6 - 20 mg/dL    Comment: Please note change in reference range.   Creatinine, Ser 0.63 0.44 - 1.00 mg/dL   Calcium 9.1 8.9 - 10.3 mg/dL   Total Protein 7.4 6.5 - 8.1 g/dL   Albumin 4.3 3.5 - 5.0 g/dL   AST 25 15 - 41 U/L   ALT 31 0 - 44 U/L    Comment: Please note change in reference range.   Alkaline Phosphatase 87 38 - 126 U/L   Total Bilirubin 0.7 0.3 - 1.2 mg/dL   GFR calc non Af Amer >60 >60 mL/min   GFR calc Af Amer >60 >60 mL/min    Comment: (NOTE) The eGFR has been calculated using the CKD EPI  equation. This calculation has not been validated in all clinical situations. eGFR's persistently <60 mL/min signify possible Chronic Kidney Disease.    Anion gap 10 5 - 15    Comment: Performed at [facility] [address], [place], [place] [address]  CBC     Status: Abnormal   Collection Time: 06/07/18 11:35 AM  Result Value Ref Range   WBC 15.9 (H) 4.0 - 10.5 K/uL   RBC 4.47 3.87 - 5.11 MIL/uL   Hemoglobin 13.7 12.0 - 15.0 g/dL   HCT 41.1 36.0 - 46.0 %   MCV 91.9 78.0 - 100.0 fL   MCH 30.6 26.0 - 34.0 pg   MCHC 33.3 30.0 - 36.0 g/dL   RDW 11.8 11.5 - 15.5 %   Platelets 303 150 - 400 K/uL    Comment: Performed at [facility], [place] [address], [place], [place] [address]  Urinalysis, Routine w reflex microscopic     Status: Abnormal   Collection Time: 06/07/18 11:39 AM  Result Value Ref Range   Color, Urine YELLOW YELLOW  APPearance TURBID (A) CLEAR   Specific Gravity, Urine 1.034 (H) 1.005 - 1.030   pH 5.0 5.0 - 8.0   Glucose, UA NEGATIVE NEGATIVE mg/dL   Hgb urine dipstick MODERATE (A) NEGATIVE   Bilirubin Urine NEGATIVE NEGATIVE   Ketones, ur NEGATIVE NEGATIVE mg/dL   Protein, ur NEGATIVE NEGATIVE mg/dL   Nitrite NEGATIVE NEGATIVE   Leukocytes, UA SMALL (A) NEGATIVE   RBC / HPF 0-5 0 - 5 RBC/hpf   WBC, UA >50 (H) 0 - 5 WBC/hpf   Bacteria, UA RARE (A) NONE SEEN   Squamous Epithelial / LPF 0-5 0 - 5   Mucus PRESENT     Comment: Performed at [facility], [address], [place], [place] [address]  I-Stat beta hCG blood, ED     Status: None   Collection Time: 06/07/18 11:47 AM  Result Value Ref Range   I-stat hCG, quantitative <5.0 <5 mIU/mL   Comment 3            Comment:   GEST. AGE      CONC.  (mIU/mL)   <=1 WEEK        5 - 50     2 WEEKS       50 - 500     3 WEEKS       100 - 10,000     4 WEEKS     1,000 - 30,000        FEMALE AND NON-PREGNANT FEMALE:     LESS THAN 5 mIU/mL    Ct Abdomen Pelvis W Contrast  Result Date:  06/07/2018 CLINICAL DATA:  Diffuse abdominal pain since 4 a.m. today. Vomiting. EXAM: CT ABDOMEN AND PELVIS WITH CONTRAST TECHNIQUE: Multidetector CT imaging of the abdomen and pelvis was performed using the standard protocol following bolus administration of intravenous contrast. CONTRAST:  18m OMNIPAQUE IOHEXOL 300 MG/ML  SOLN COMPARISON:  None. FINDINGS: Lower chest: Unremarkable. Hepatobiliary: Diffuse low density of the liver relative to the spleen. Normal appearing gallbladder. Pancreas: Unremarkable. No pancreatic ductal dilatation or surrounding inflammatory changes. Spleen: Single calcified granuloma. Adrenals/Urinary Tract: Adrenal glands are unremarkable. Kidneys are normal, without renal calculi, focal lesion, or hydronephrosis. Bladder is unremarkable. Stomach/Bowel: Markedly dilated, fluid-filled appendix containing multiple small appendicoliths proximally. Minimal periappendiceal soft tissue stranding. Appendix: Location: Arising at the junction of the right lower abdomen and upper pelvis and extending across the midline in the upper pelvis. Diameter: 1.6 cm Appendicolith: Multiple small proximal appendicoliths. Mucosal hyper-enhancement: No. Extraluminal gas: No. Periappendiceal collection: No. Unremarkable stomach, small bowel and colon. Vascular/Lymphatic: Mildly enlarged right lower quadrant mesenteric lymph nodes. The largest has a short axis diameter of 8 mm on image number 45 series 3. Reproductive: Uterus and bilateral adnexa are unremarkable. Other: No abdominal wall hernia or abnormality. No abdominopelvic ascites. Musculoskeletal: Minimal lumbar and lower thoracic spine degenerative changes. IMPRESSION: 1. Acute appendicitis without abscess. 2. Diffuse hepatic steatosis. These results were called by telephone at the time of interpretation on 06/07/2018 at 2:00 pm to Dr. MCharlesetta Shanks who verbally acknowledged these results. Electronically Signed   By: SClaudie ReveringM.D.   On: 06/07/2018  14:05    Review of Systems  Constitutional: Negative for chills and fever.  HENT: Negative.   Eyes: Negative.   Respiratory: Negative.   Cardiovascular: Negative.   Gastrointestinal: Positive for abdominal pain and nausea. Negative for vomiting.  Genitourinary: Negative.   Musculoskeletal: Negative.   All other systems reviewed and are negative.   Blood  pressure 137/70, pulse (!) 58, temperature 98.2 F (36.8 C), temperature source Oral, resp. rate 16, SpO2 100 %. Physical Exam  Vitals reviewed. Constitutional: She is oriented to person, place, and time. She appears well-developed and well-nourished.  HENT:  Head: Normocephalic and atraumatic.  Eyes: Pupils are equal, round, and reactive to light. Conjunctivae and EOM are normal.  Neck: Normal range of motion. Neck supple.  Cardiovascular: Normal rate, regular rhythm, normal heart sounds and intact distal pulses.  Respiratory: Effort normal and breath sounds normal.  GI: Soft. Normal appearance and bowel sounds are normal. She exhibits no distension. There is tenderness in the right lower quadrant. There is rebound and guarding. There is no rigidity.  Musculoskeletal: Normal range of motion.  Neurological: She is alert and oriented to person, place, and time. She has normal reflexes.  Skin: Skin is warm and dry.  Psychiatric: She has a normal mood and affect. Her behavior is normal. Judgment and thought content normal.  Broken English     Assessment/Plan Acute appendicitis Has gotten Rocephin and flagyl  To go to the OR ASAP, very large appendix across the center of her abdomen. No evidence of perforation.  Judeth Horn, MD 06/07/2018, 2:59 PM

## 2018-06-07 NOTE — ED Triage Notes (Signed)
Pt presents for evaluation of generalized abd pain with vomiting since 4 am. Reports pain started before vomiting. Denies diarrhea.

## 2018-06-07 NOTE — ED Notes (Signed)
Pt returned from CT °

## 2018-06-07 NOTE — Transfer of Care (Signed)
Immediate Anesthesia Transfer of Care Note  Patient: Tammy Rojas  Procedure(s) Performed: APPENDECTOMY LAPAROSCOPIC (N/A Abdomen)  Patient Location: PACU  Anesthesia Type:General  Level of Consciousness: drowsy  Airway & Oxygen Therapy: Patient Spontanous Breathing and Patient connected to nasal cannula oxygen  Post-op Assessment: Report given to RN and Post -op Vital signs reviewed and stable  Post vital signs: Reviewed and stable  Last Vitals:  Vitals Value Taken Time  BP 106/59 06/07/2018  5:28 PM  Temp    Pulse 82 06/07/2018  5:30 PM  Resp 21 06/07/2018  5:30 PM  SpO2 100 % 06/07/2018  5:30 PM  Vitals shown include unvalidated device data.  Last Pain:  Vitals:   06/07/18 1342  TempSrc:   PainSc: 8          Complications: No apparent anesthesia complications

## 2018-06-07 NOTE — Anesthesia Postprocedure Evaluation (Signed)
Anesthesia Post Note  Patient: Tammy Rojas  Procedure(s) Performed: APPENDECTOMY LAPAROSCOPIC (N/A Abdomen)     Patient location during evaluation: PACU Anesthesia Type: General Level of consciousness: awake and alert Pain management: pain level controlled Vital Signs Assessment: post-procedure vital signs reviewed and stable Respiratory status: spontaneous breathing, nonlabored ventilation, respiratory function stable and patient connected to nasal cannula oxygen Cardiovascular status: blood pressure returned to baseline and stable Postop Assessment: no apparent nausea or vomiting Anesthetic complications: no    Last Vitals:  Vitals:   06/07/18 1828 06/07/18 1900  BP: 103/83 113/71  Pulse: 77 62  Resp: 16   Temp: 36.5 C 36.8 C  SpO2: 100% 100%    Last Pain:  Vitals:   06/07/18 1900  TempSrc: Oral  PainSc:                  Tashaya Ancrum

## 2018-06-07 NOTE — Anesthesia Procedure Notes (Signed)
Procedure Name: Intubation Date/Time: 06/07/2018 4:23 PM Performed by: Bryson Corona, CRNA Pre-anesthesia Checklist: Patient identified, Emergency Drugs available, Suction available and Patient being monitored Patient Re-evaluated:Patient Re-evaluated prior to induction Oxygen Delivery Method: Circle System Utilized Preoxygenation: Pre-oxygenation with 100% oxygen Induction Type: IV induction Ventilation: Mask ventilation without difficulty Laryngoscope Size: Mac and 3 Grade View: Grade I Tube type: Oral Number of attempts: 1 Airway Equipment and Method: Stylet Placement Confirmation: ETT inserted through vocal cords under direct vision,  positive ETCO2 and breath sounds checked- equal and bilateral Secured at: 21 cm Tube secured with: Tape Dental Injury: Teeth and Oropharynx as per pre-operative assessment

## 2018-06-07 NOTE — ED Provider Notes (Signed)
[facility] EMERGENCY DEPARTMENT Provider Note   CSN: [id number] Arrival date & time: 06/07/18  1123     History   Chief Complaint Chief Complaint  Patient presents with  . Abdominal Pain    HPI Tammy Rojas is a 32 y.o. female.  The history is provided by the patient. No language interpreter was used.  Abdominal Pain   This is a new problem. The current episode started yesterday. The problem occurs constantly. The problem has been gradually worsening. The pain is associated with an unknown factor. The pain is located in the RLQ and LLQ. The pain is moderate. Associated symptoms include nausea. Pertinent negatives include fever. Nothing aggravates the symptoms. Nothing relieves the symptoms. Her past medical history does not include PUD, GERD, ulcerative colitis or irritable bowel syndrome.    Past Medical History:  Diagnosis Date  . No pertinent past medical history     There are no active problems to display for this patient.   Past Surgical History:  Procedure Laterality Date  . CESAREAN SECTION       OB History    Gravida  3   Para  3   Term  3   Preterm  0   AB  0   Living  3     SAB  0   TAB  0   Ectopic  0   Multiple  0   Live Births  1            Home Medications    Prior to Admission medications   Medication Sig Start Date End Date Taking? Authorizing Provider  Prenatal Vit-Fe Fumarate-FA (PRENATAL MULTIVITAMIN) TABS Take 1 tablet by mouth daily.    [provider]    Family History Family History  Problem Relation Age of Onset  . Early death Mother   . Diabetes Father     Social History Social History   Tobacco Use  . Smoking status: Never Smoker  . Smokeless tobacco: Never Used  Substance Use Topics  . Alcohol use: No  . Drug use: No     Allergies   Patient has no known allergies.   Review of Systems Review of Systems  Constitutional: Negative for chills and fever.    Gastrointestinal: Positive for abdominal pain and nausea.  Genitourinary: Negative for vaginal pain.  All other systems reviewed and are negative.    Physical Exam Updated Vital Signs BP 137/70 (BP Location: Right Arm)   Pulse (!) 58   Temp 98.2 F (36.8 C) (Oral)   Resp 16   SpO2 100%   Physical Exam  Constitutional: She appears well-developed and well-nourished.  HENT:  Head: Normocephalic and atraumatic.  Mouth/Throat: Oropharynx is clear and moist.  Eyes: Pupils are equal, round, and reactive to light. EOM are normal.  Cardiovascular: Normal rate and regular rhythm.  Pulmonary/Chest: Breath sounds normal.  Abdominal: Normal appearance and bowel sounds are normal. There is tenderness in the right lower quadrant and left lower quadrant. No hernia.  Neurological: She is alert.  Skin: Skin is warm.  Psychiatric: She has a normal mood and affect.  Nursing note and vitals reviewed.    ED Treatments / Results  Labs (all labs ordered are listed, but only abnormal results are displayed) Labs Reviewed  COMPREHENSIVE METABOLIC PANEL - Abnormal; Notable for the following components:      Result Value   CO2 20 (*)    Glucose, Bld 122 (*)    All  other components within normal limits  CBC - Abnormal; Notable for the following components:   WBC 15.9 (*)    All other components within normal limits  URINALYSIS, ROUTINE W REFLEX MICROSCOPIC - Abnormal; Notable for the following components:   APPearance TURBID (*)    Specific Gravity, Urine 1.034 (*)    Hgb urine dipstick MODERATE (*)    Leukocytes, UA SMALL (*)    WBC, UA >50 (*)    Bacteria, UA RARE (*)    All other components within normal limits  LIPASE, BLOOD  I-STAT BETA HCG BLOOD, ED ([facility], [facility], [facility] ONLY)    EKG None  Radiology Ct Abdomen Pelvis W Contrast  Result Date: 06/07/2018 CLINICAL DATA:  Diffuse abdominal pain since 4 a.m. today. Vomiting. EXAM: CT ABDOMEN AND PELVIS WITH CONTRAST TECHNIQUE: Multidetector  CT imaging of the abdomen and pelvis was performed using the standard protocol following bolus administration of intravenous contrast. CONTRAST:  156mL OMNIPAQUE IOHEXOL 300 MG/ML  SOLN COMPARISON:  None. FINDINGS: Lower chest: Unremarkable. Hepatobiliary: Diffuse low density of the liver relative to the spleen. Normal appearing gallbladder. Pancreas: Unremarkable. No pancreatic ductal dilatation or surrounding inflammatory changes. Spleen: Single calcified granuloma. Adrenals/Urinary Tract: Adrenal glands are unremarkable. Kidneys are normal, without renal calculi, focal lesion, or hydronephrosis. Bladder is unremarkable. Stomach/Bowel: Markedly dilated, fluid-filled appendix containing multiple small appendicoliths proximally. Minimal periappendiceal soft tissue stranding. Appendix: Location: Arising at the junction of the right lower abdomen and upper pelvis and extending across the midline in the upper pelvis. Diameter: 1.6 cm Appendicolith: Multiple small proximal appendicoliths. Mucosal hyper-enhancement: No. Extraluminal gas: No. Periappendiceal collection: No. Unremarkable stomach, small bowel and colon. Vascular/Lymphatic: Mildly enlarged right lower quadrant mesenteric lymph nodes. The largest has a short axis diameter of 8 mm on image number 45 series 3. Reproductive: Uterus and bilateral adnexa are unremarkable. Other: No abdominal wall hernia or abnormality. No abdominopelvic ascites. Musculoskeletal: Minimal lumbar and lower thoracic spine degenerative changes. IMPRESSION: 1. Acute appendicitis without abscess. 2. Diffuse hepatic steatosis. These results were called by telephone at the time of interpretation on 06/07/2018 at 2:00 pm to Dr. Charlesetta Shanks, who verbally acknowledged these results. Electronically Signed   By: Claudie Revering M.D.   On: 06/07/2018 14:05    Procedures Procedures (including critical care time)  Medications Ordered in ED Medications  cefTRIAXone (ROCEPHIN) 2 g in sodium  chloride 0.9 % 100 mL IVPB (has no administration in time range)    And  metroNIDAZOLE (FLAGYL) IVPB 500 mg (has no administration in time range)  sodium chloride 0.9 % bolus 1,000 mL (has no administration in time range)    Followed by  0.9 %  sodium chloride infusion (has no administration in time range)  ondansetron (ZOFRAN-ODT) disintegrating tablet 4 mg (4 mg Oral Given 06/07/18 1130)  sodium chloride 0.9 % bolus 1,000 mL (0 mLs Intravenous Stopped 06/07/18 1408)  ondansetron (ZOFRAN) injection 4 mg (4 mg Intravenous Given 06/07/18 1232)  HYDROmorphone (DILAUDID) injection 0.5 mg (0.5 mg Intravenous Given 06/07/18 1235)  iohexol (OMNIPAQUE) 300 MG/ML solution 100 mL (100 mLs Intravenous Contrast Given 06/07/18 1324)     Initial Impression / Assessment and Plan / ED Course  I have reviewed the triage vital signs and the nursing notes.  Pertinent labs & imaging results that were available during my care of the patient were reviewed by me and considered in my medical decision making (see chart for details).     MDM  Pt has an elevated wbc  count of 15.  Pt given zofran and dilaudid for pain.   Ct scan shows acute appendicitis.   Iv antibiotics ordered. I spoke to Dr. Louie Bun Surgeon who will see pt   Final Clinical Impressions(s) / ED Diagnoses   Final diagnoses:  Other acute appendicitis  Acute appendicitis, unspecified acute appendicitis type    ED Discharge Orders    None       Sidney Ace 06/07/18 1455    Charlesetta Shanks, MD 06/21/18 2326

## 2018-06-07 NOTE — ED Notes (Signed)
Patient transported to CT 

## 2018-06-08 ENCOUNTER — Encounter (HOSPITAL_COMMUNITY): Payer: Self-pay | Admitting: General Surgery

## 2018-06-08 ENCOUNTER — Other Ambulatory Visit: Payer: Self-pay

## 2018-06-08 LAB — CBC
HCT: 38 % (ref 36.0–46.0)
Hemoglobin: 12.5 g/dL (ref 12.0–15.0)
MCH: 30.3 pg (ref 26.0–34.0)
MCHC: 32.9 g/dL (ref 30.0–36.0)
MCV: 92.2 fL (ref 78.0–100.0)
Platelets: 280 K/uL (ref 150–400)
RBC: 4.12 MIL/uL (ref 3.87–5.11)
RDW: 12 % (ref 11.5–15.5)
WBC: 20.1 K/uL — ABNORMAL HIGH (ref 4.0–10.5)

## 2018-06-08 MED ORDER — OXYCODONE HCL 5 MG PO TABS: 5.0000 mg | ORAL_TABLET | Freq: Four times a day (QID) | ORAL | 0 refills | Status: DC | PRN

## 2018-06-08 NOTE — Discharge Instructions (Signed)
[facility] ______[facility] °LAPAROSCOPIC SURGERY: POST OP INSTRUCTIONS °Always review your discharge instruction sheet given to you by the facility where your surgery was performed. °IF YOU HAVE DISABILITY OR FAMILY LEAVE FORMS, YOU MUST BRING THEM TO THE OFFICE FOR PROCESSING.   °DO NOT GIVE THEM TO YOUR DOCTOR. ° °1. A prescription for pain medication may be given to you upon discharge.  Take your pain medication as prescribed, if needed.  If narcotic pain medicine is not needed, then you may take acetaminophen (Tylenol) or ibuprofen (Advil) as needed. °2. Take your usually prescribed medications unless otherwise directed. °3. If you need a refill on your pain medication, please contact your pharmacy.  They will contact our office to request authorization. Prescriptions will not be filled after 5pm or on week-ends. °4. You should follow a light diet the first few days after arrival home, such as soup and crackers, etc.  Be sure to include lots of fluids daily. °5. Most patients will experience some swelling and bruising in the area of the incisions.  Ice packs will help.  Swelling and bruising can take several days to resolve.  °6. It is common to experience some constipation if taking pain medication after surgery.  Increasing fluid intake and taking a stool softener (such as Colace) will usually help or prevent this problem from occurring.  A mild laxative (Milk of Magnesia or Miralax) should be taken according to package instructions if there are no bowel movements after 48 hours. °7. Unless discharge instructions indicate otherwise, you may remove your bandages 24-48 hours after surgery, and you may shower at that time.  You may have steri-strips (small skin tapes) in place directly over the incision.  These strips should be left on the skin for 7-10 days.  If your surgeon used skin glue on the incision, you may shower in 24 hours.  The glue will flake off over the next 2-3 weeks.  Any sutures or  staples will be removed at the office during your follow-up visit. °8. ACTIVITIES:  You may resume regular (light) daily activities beginning the next day--such as daily self-care, walking, climbing stairs--gradually increasing activities as tolerated.  You may have sexual intercourse when it is comfortable.  Refrain from any heavy lifting or straining until approved by your doctor. °a. You may drive when you are no longer taking prescription pain medication, you can comfortably wear a seatbelt, and you can safely maneuver your car and apply brakes. °b. RETURN TO WORK:  __________________________________________________________ °9. You should see your doctor in the office for a follow-up appointment approximately 2-3 weeks after your surgery.  Make sure that you call for this appointment within a day or two after you arrive home to insure a convenient appointment time. °10. OTHER INSTRUCTIONS: __________________________________________________________________________________________________________________________ __________________________________________________________________________________________________________________________ °WHEN TO CALL YOUR DOCTOR: °1. Fever over 101.0 °2. Inability to urinate °3. Continued bleeding from incision. °4. Increased pain, redness, or drainage from the incision. °5. Increasing abdominal pain ° °The clinic staff is available to answer your questions during regular business hours.  Please don’t hesitate to call and ask to speak to one of the nurses for clinical concerns.  If you have a medical emergency, go to the nearest emergency room or call 911.  A surgeon from [facility] is always on call at the hospital. °[address], [address], [place], [place]  [address] ? [address] [address], [place], [place]   [address] °[phone] ? [phone] ? FAX [phone] °Web site:   [email or web address] °

## 2018-06-08 NOTE — Discharge Summary (Signed)
  Patient ID: Tammy Rojas [id number] 32 y.o. 1986/03/25  06/07/2018  Discharge date and time: 06/08/2018   Admitting Physician: Edward Jolly  Discharge Physician: Edward Jolly  Admission Diagnoses: Other acute appendicitis [K35.890] Acute appendicitis, unspecified acute appendicitis type [K35.80]  Discharge Diagnoses: Same  Operations: Procedure(s): APPENDECTOMY LAPAROSCOPIC  Admission Condition: fair  Discharged Condition: good  Indication for Admission: Patient presents with acute lower abdominal pain and CT findings confirming acute appendicitis.  She is admitted for emergency appendectomy.  Hospital Course: Patient underwent an uneventful laparoscopic appendectomy with findings of uncomplicated acute appendicitis.  The following morning her pain is much improved.  No specific complaints.  Tolerating diet.  Wounds are without erythema or drainage.  Abdomen is soft and nontender.  She is felt ready for discharge.   Disposition: Home  Patient Instructions:  Allergies as of 06/08/2018   No Known Allergies     Medication List    TAKE these medications   oxyCODONE 5 MG immediate release tablet Commonly known as:  Oxy IR/ROXICODONE Take 1 tablet (5 mg total) by mouth every 6 (six) hours as needed for moderate pain.   prenatal multivitamin Tabs tablet Take 1 tablet by mouth daily.       Activity: activity as tolerated Diet: regular diet Wound Care: none needed  Follow-up:  With [place] clinic in 2 weeks.  Signed: Edward Jolly MD, FACS  06/08/2018, 9:15 AM

## 2018-09-23 IMAGING — CT CT ABD-PELV W/ CM
2 of 4 series · 16 of 46 positions shown, 18 images · IV contrast (APPLIED)
Comparison: None.

CLINICAL DATA: Diffuse abdominal pain since 4 a.m. today. Vomiting.

EXAM:
CT ABDOMEN AND PELVIS WITH CONTRAST
TECHNIQUE: Multidetector CT imaging of the abdomen and pelvis was performed
using the standard protocol following bolus administration of
intravenous contrast.
CONTRAST:  100mL OMNIPAQUE IOHEXOL 300 MG/ML  SOLN

[Series 3: abd/ pelvis 5.0 i30f 2 · axial · 0.74mm/px · z∈[+712,+1088]mm · 13 of 89 slices shown, 15 images]
[im 7/89  soft-tissue]
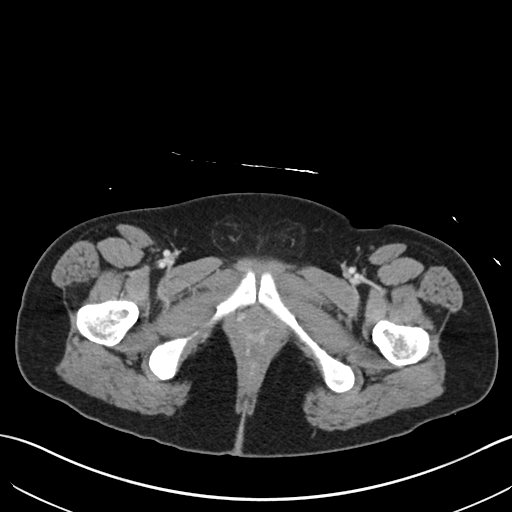
[im 7/89  bone]
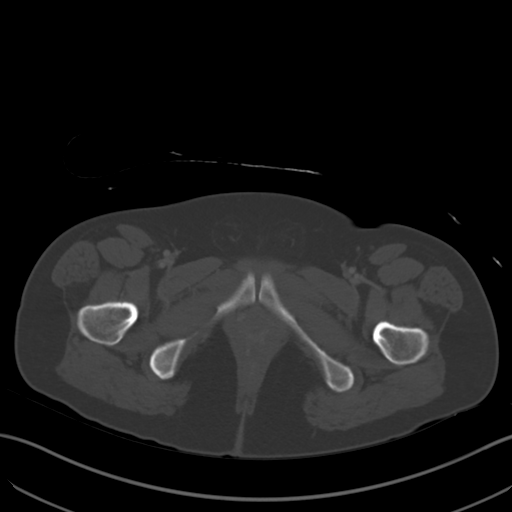
[im 13/89  soft-tissue]
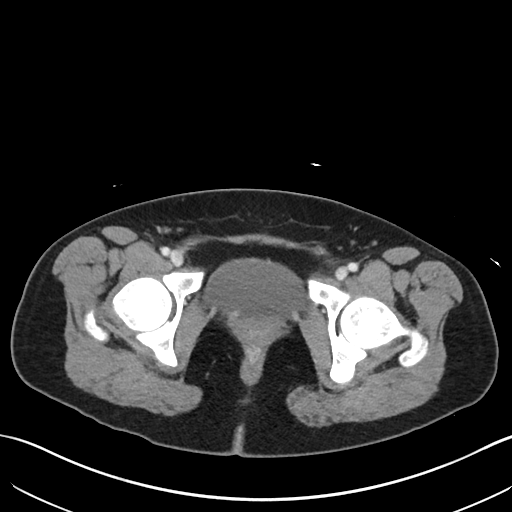
[im 19/89  soft-tissue]
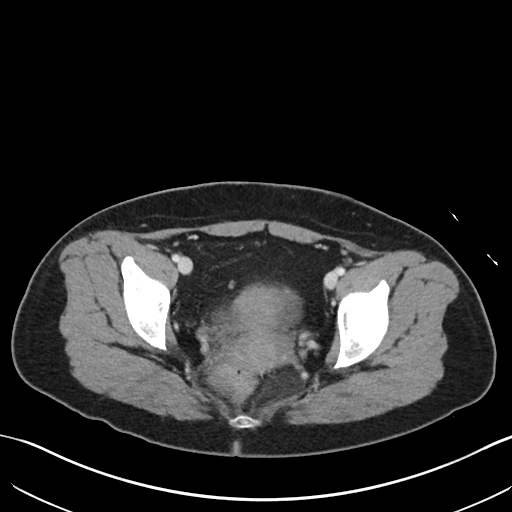
[im 26/89  soft-tissue]
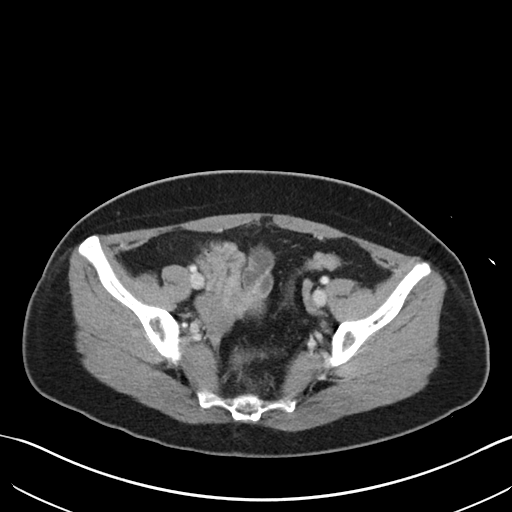
[im 32/89  soft-tissue]
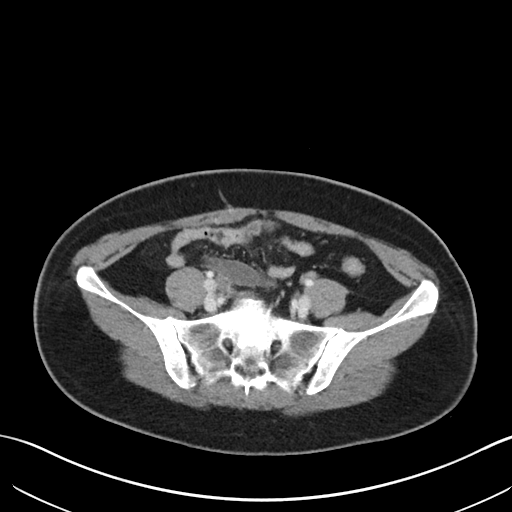
[im 38/89  soft-tissue]
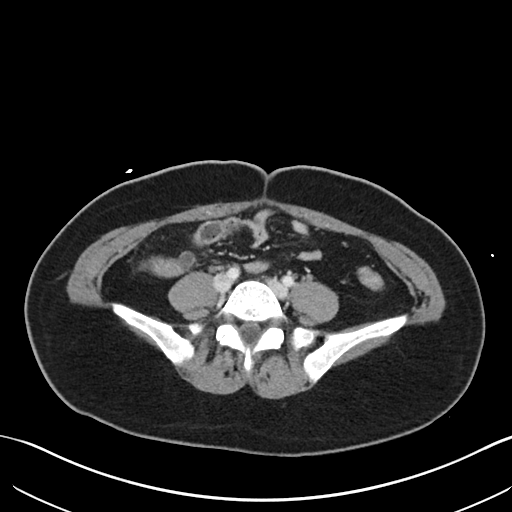
[im 45/89  soft-tissue]
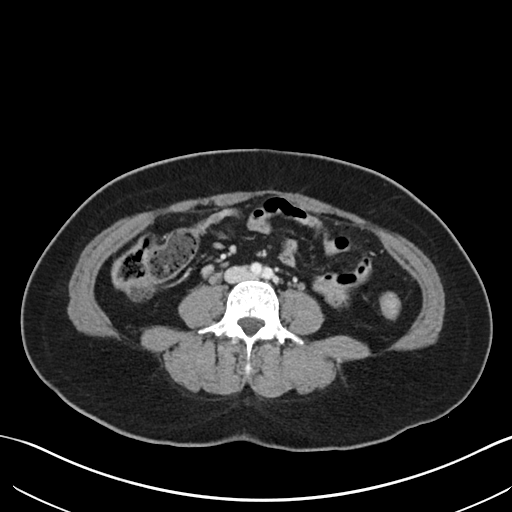
[im 51/89  soft-tissue]
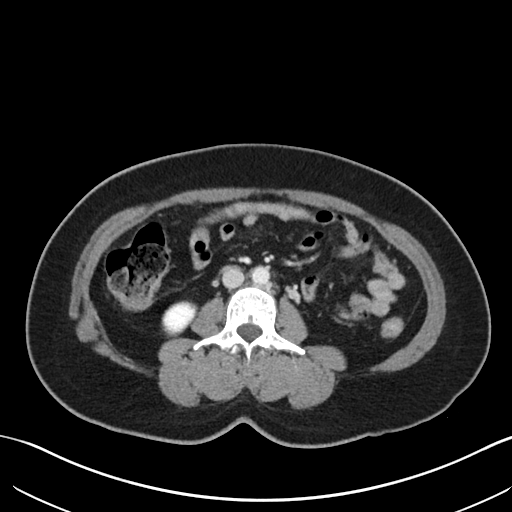
[im 57/89  soft-tissue]
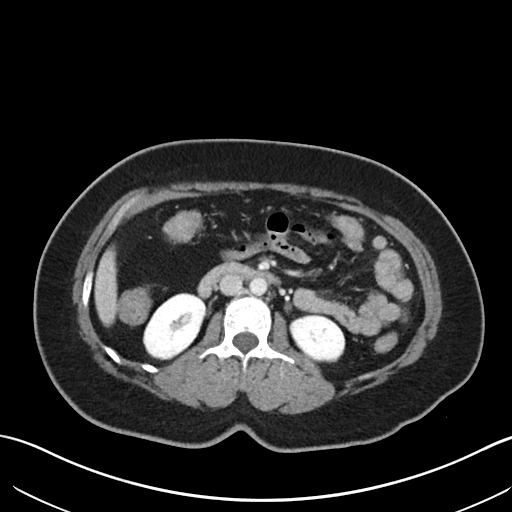
[im 57/89  bone]
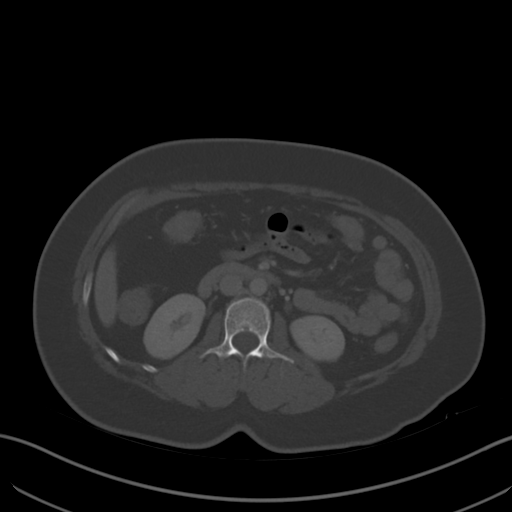
[im 63/89  soft-tissue]
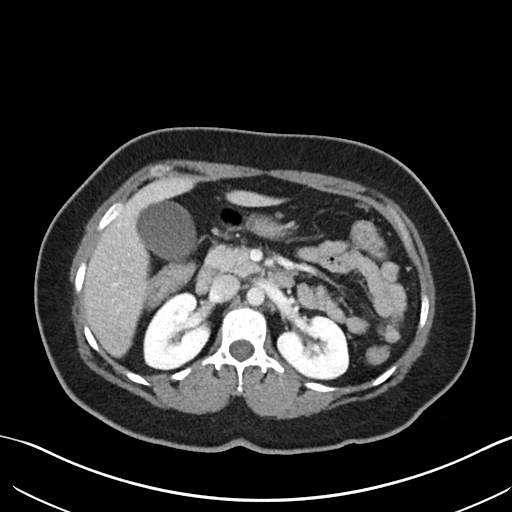
[im 70/89  soft-tissue]
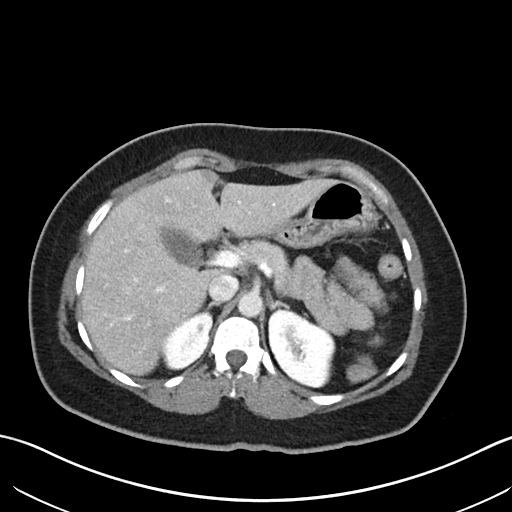
[im 76/89  soft-tissue]
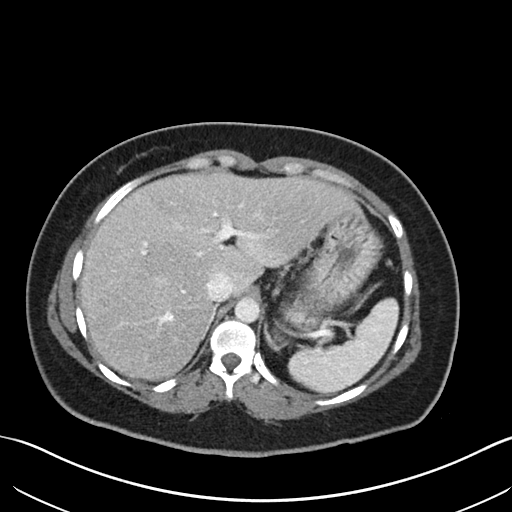
[im 82/89  soft-tissue]
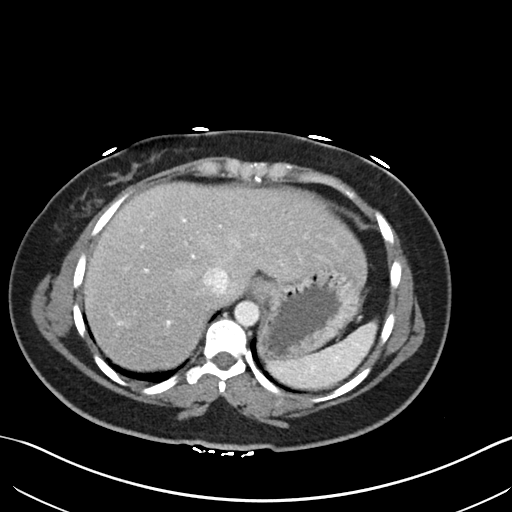

[Series 6: coronal soft tissue · coronal · 0.74mm/px · 3 of 93 slices shown]
[im 31/93  soft-tissue]
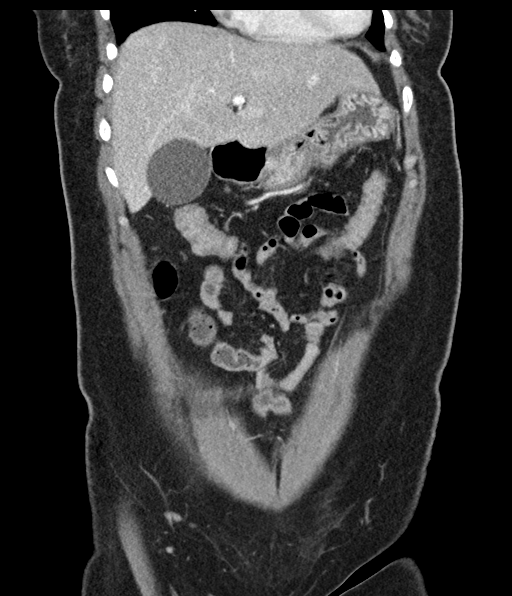
[im 41/93  soft-tissue]
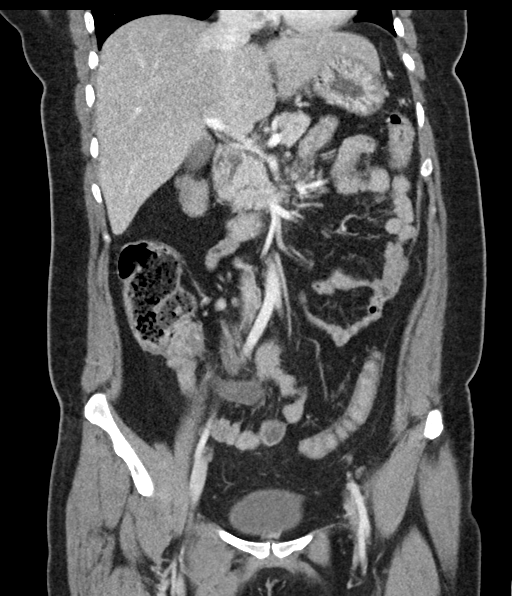
[im 52/93  soft-tissue]
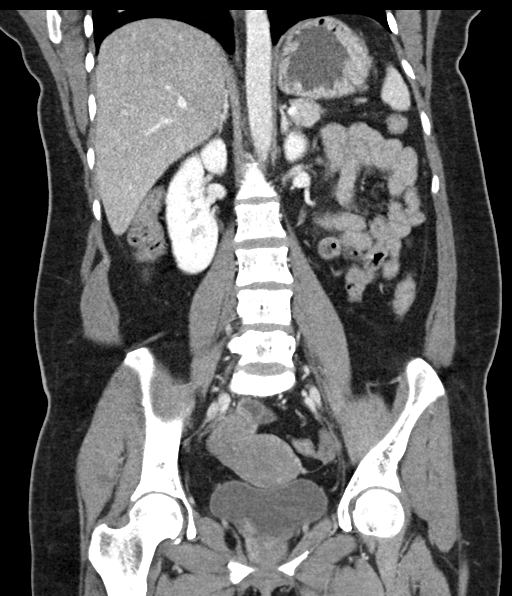

[16 of 46 positions shown; findings below may reference images not displayed]

FINDINGS: Lower chest: Unremarkable.

Hepatobiliary: Diffuse low density of the liver relative to the
spleen. Normal appearing gallbladder.

Pancreas: Unremarkable. No pancreatic ductal dilatation or
surrounding inflammatory changes.

Spleen: Single calcified granuloma.

Adrenals/Urinary Tract: Adrenal glands are unremarkable. Kidneys are
normal, without renal calculi, focal lesion, or hydronephrosis.
Bladder is unremarkable.

Stomach/Bowel: Markedly dilated, fluid-filled appendix containing
multiple small appendicoliths proximally. Minimal periappendiceal
soft tissue stranding.

Appendix: Location: Arising at the junction of the right lower
abdomen and upper pelvis and extending across the midline in the
upper pelvis.

Diameter: 1.6 cm

Appendicolith: Multiple small proximal appendicoliths.

Mucosal hyper-enhancement: No.

Extraluminal gas: No.

Periappendiceal collection: No.

Unremarkable stomach, small bowel and colon.

Vascular/Lymphatic: Mildly enlarged right lower quadrant mesenteric
lymph nodes. The largest has a short axis diameter of 8 mm on image
number 45 series 3.

Reproductive: Uterus and bilateral adnexa are unremarkable.

Other: No abdominal wall hernia or abnormality. No abdominopelvic
ascites.

Musculoskeletal: Minimal lumbar and lower thoracic spine
degenerative changes.
IMPRESSION: 1. Acute appendicitis without abscess.
2. Diffuse hepatic steatosis.

These results were called by telephone at the time of interpretation
on 06/07/2018 at [DATE] to Dr. Mellatia Crowzero, who verbally
acknowledged these results.

## 2018-11-19 HISTORY — PX: APPENDECTOMY: SHX54

## 2019-01-01 ENCOUNTER — Ambulatory Visit
Admission: RE | Admit: 2019-01-01 | Discharge: 2019-01-01 | Disposition: A | Discharge status: Home / Self Care | Payer: No Typology Code available for payment source | Source: Ambulatory Visit | Attending: Internal Medicine | Admitting: Internal Medicine

## 2019-01-01 ENCOUNTER — Other Ambulatory Visit: Payer: Self-pay | Admitting: Internal Medicine

## 2019-01-01 DIAGNOSIS — R9389 Abnormal findings on diagnostic imaging of other specified body structures: Secondary | ICD-10-CM

## 2019-05-13 ENCOUNTER — Emergency Department (HOSPITAL_COMMUNITY)
Admission: EM | Admit: 2019-05-13 | Discharge: 2019-05-13 | Disposition: A | Discharge status: Home / Self Care | Payer: HRSA Program | Attending: Emergency Medicine | Admitting: Emergency Medicine

## 2019-05-13 ENCOUNTER — Other Ambulatory Visit: Payer: Self-pay

## 2019-05-13 ENCOUNTER — Emergency Department (HOSPITAL_COMMUNITY): Payer: HRSA Program

## 2019-05-13 DIAGNOSIS — U071 COVID-19: Secondary | ICD-10-CM

## 2019-05-13 DIAGNOSIS — R05 Cough: Secondary | ICD-10-CM | POA: Diagnosis present

## 2019-05-13 LAB — BASIC METABOLIC PANEL
Anion gap: 9 (ref 5–15)
BUN: 5 mg/dL — ABNORMAL LOW (ref 6–20)
CO2: 25 mmol/L (ref 22–32)
Calcium: 8.5 mg/dL — ABNORMAL LOW (ref 8.9–10.3)
Chloride: 104 mmol/L (ref 98–111)
Creatinine, Ser: 0.78 mg/dL (ref 0.44–1.00)
GFR calc Af Amer: >60 mL/min (ref >60)
GFR calc non Af Amer: >60 mL/min (ref >60)
Glucose, Bld: 111 mg/dL — ABNORMAL HIGH (ref 70–99)
Potassium: 3.5 mmol/L (ref 3.5–5.1)
Sodium: 138 mmol/L (ref 135–145)

## 2019-05-13 LAB — CBC WITH DIFFERENTIAL/PLATELET
Abs Immature Granulocytes: 0.04 10*3/uL (ref 0.00–0.07)
Basophils Absolute: 0 10*3/uL (ref 0.0–0.1)
Basophils Relative: 0 %
Eosinophils Absolute: 0 10*3/uL (ref 0.0–0.5)
Eosinophils Relative: 0 %
HCT: 40.8 % (ref 36.0–46.0)
Hemoglobin: 13.2 g/dL (ref 12.0–15.0)
Immature Granulocytes: 0 %
Lymphocytes Relative: 13 %
Lymphs Abs: 1.4 10*3/uL (ref 0.7–4.0)
MCH: 30.4 pg (ref 26.0–34.0)
MCHC: 32.4 g/dL (ref 30.0–36.0)
MCV: 94 fL (ref 80.0–100.0)
Monocytes Absolute: 0.5 10*3/uL (ref 0.1–1.0)
Monocytes Relative: 5 %
Neutro Abs: 8.9 10*3/uL — ABNORMAL HIGH (ref 1.7–7.7)
Neutrophils Relative %: 82 %
Platelets: 348 10*3/uL (ref 150–400)
RBC: 4.34 MIL/uL (ref 3.87–5.11)
RDW: 12.2 % (ref 11.5–15.5)
WBC: 10.8 10*3/uL — ABNORMAL HIGH (ref 4.0–10.5)
nRBC: 0 % (ref 0.0–0.2)

## 2019-05-13 LAB — SARS CORONAVIRUS 2 BY RT PCR (HOSPITAL ORDER, PERFORMED IN ~~LOC~~ HOSPITAL LAB): SARS Coronavirus 2: POSITIVE — AB

## 2019-05-13 LAB — PREGNANCY, URINE: Preg Test, Ur: NEGATIVE

## 2019-05-13 MED ORDER — SODIUM CHLORIDE 0.9 % IV BOLUS
1000.0000 mL | Freq: Once | INTRAVENOUS | Status: AC
  Administered 2019-05-13: 1000 mL via INTRAVENOUS

## 2019-05-13 MED ORDER — MORPHINE SULFATE (PF) 4 MG/ML IV SOLN
4.0000 mg | Freq: Once | INTRAVENOUS | Status: AC
  Administered 2019-05-13: 4 mg via INTRAVENOUS
  Filled 2019-05-13: qty 1

## 2019-05-13 MED ORDER — IBUPROFEN 200 MG PO TABS
600.0000 mg | ORAL_TABLET | Freq: Once | ORAL | Status: AC
  Administered 2019-05-13: 600 mg via ORAL
  Filled 2019-05-13: qty 1

## 2019-05-13 MED ORDER — AZITHROMYCIN 250 MG PO TABS: 250.0000 mg | ORAL_TABLET | Freq: Every day | ORAL | 0 refills | Status: DC

## 2019-05-13 NOTE — Discharge Instructions (Signed)
Person Under Monitoring Name: Tammy Rojas  Location: [place] [address]   Infection Prevention Recommendations for Individuals Confirmed to have, or Being Evaluated for, 2019 Novel Coronavirus (COVID-19) Infection Who Receive Care at Home  Individuals who are confirmed to have, or are being evaluated for, COVID-19 should follow the prevention steps below until a healthcare provider or local or state health department says they can return to normal activities.  Stay home except to get medical care You should restrict activities outside your home, except for getting medical care. Do not go to work, school, or public areas, and do not use public transportation or taxis.  Call ahead before visiting your doctor Before your medical appointment, call the healthcare provider and tell them that you have, or are being evaluated for, COVID-19 infection. This will help the healthcare providers office take steps to keep other people from getting infected. Ask your healthcare provider to call the local or state health department.  Monitor your symptoms Seek prompt medical attention if your illness is worsening (e.g., difficulty breathing). Before going to your medical appointment, call the healthcare provider and tell them that you have, or are being evaluated for, COVID-19 infection. Ask your healthcare provider to call the local or state health department.  Wear a facemask You should wear a facemask that covers your nose and mouth when you are in the same room with other people and when you visit a healthcare provider. People who live with or visit you should also wear a facemask while they are in the same room with you.  Separate yourself from other people in your home As much as possible, you should stay in a different room from other people in your home. Also, you should use a separate bathroom, if available.  Avoid sharing household items You should not  share dishes, drinking glasses, cups, eating utensils, towels, bedding, or other items with other people in your home. After using these items, you should wash them thoroughly with soap and water.  Cover your coughs and sneezes Cover your mouth and nose with a tissue when you cough or sneeze, or you can cough or sneeze into your sleeve. Throw used tissues in a lined trash can, and immediately wash your hands with soap and water for at least 20 seconds or use an alcohol-based hand rub.  Wash your [facility] your hands often and thoroughly with soap and water for at least 20 seconds. You can use an alcohol-based hand sanitizer if soap and water are not available and if your hands are not visibly dirty. Avoid touching your eyes, nose, and mouth with unwashed hands.   Prevention Steps for Caregivers and Household Members of Individuals Confirmed to have, or Being Evaluated for, COVID-19 Infection Being Cared for in the Home  If you live with, or provide care at home for, a person confirmed to have, or being evaluated for, COVID-19 infection please follow these guidelines to prevent infection:  Follow healthcare providers instructions Make sure that you understand and can help the patient follow any healthcare provider instructions for all care.  Provide for the patients basic needs You should help the patient with basic needs in the home and provide support for getting groceries, prescriptions, and other personal needs.  Monitor the patients symptoms If they are getting sicker, call his or her medical provider and tell them that the patient has, or is being evaluated for, COVID-19 infection. This will help the healthcare providers office take  steps to keep other people from getting infected. Ask the healthcare provider to call the local or state health department.  Limit the number of people who have contact with the patient If possible, have only one caregiver for the  patient. Other household members should stay in another home or place of residence. If this is not possible, they should stay in another room, or be separated from the patient as much as possible. Use a separate bathroom, if available. Restrict visitors who do not have an essential need to be in the home.  Keep older adults, very young children, and other sick people away from the patient Keep older adults, very young children, and those who have compromised immune systems or chronic health conditions away from the patient. This includes people with chronic heart, lung, or kidney conditions, diabetes, and cancer.  Ensure good ventilation Make sure that shared spaces in the home have good air flow, such as from an air conditioner or an opened window, weather permitting.  Wash your hands often Wash your hands often and thoroughly with soap and water for at least 20 seconds. You can use an alcohol based hand sanitizer if soap and water are not available and if your hands are not visibly dirty. Avoid touching your eyes, nose, and mouth with unwashed hands. Use disposable paper towels to dry your hands. If not available, use dedicated cloth towels and replace them when they become wet.  Wear a facemask and gloves Wear a disposable facemask at all times in the room and gloves when you touch or have contact with the patients blood, body fluids, and/or secretions or excretions, such as sweat, saliva, sputum, nasal mucus, vomit, urine, or feces.  Ensure the mask fits over your nose and mouth tightly, and do not touch it during use. Throw out disposable facemasks and gloves after using them. Do not reuse. Wash your hands immediately after removing your facemask and gloves. If your personal clothing becomes contaminated, carefully remove clothing and launder. Wash your hands after handling contaminated clothing. Place all used disposable facemasks, gloves, and other waste in a lined container before  disposing them with other household waste. Remove gloves and wash your hands immediately after handling these items.  Do not share dishes, glasses, or other household items with the patient Avoid sharing household items. You should not share dishes, drinking glasses, cups, eating utensils, towels, bedding, or other items with a patient who is confirmed to have, or being evaluated for, COVID-19 infection. After the person uses these items, you should wash them thoroughly with soap and water.  Wash laundry thoroughly Immediately remove and wash clothes or bedding that have blood, body fluids, and/or secretions or excretions, such as sweat, saliva, sputum, nasal mucus, vomit, urine, or feces, on them. Wear gloves when handling laundry from the patient. Read and follow directions on labels of laundry or clothing items and detergent. In general, wash and dry with the warmest temperatures recommended on the label.  Clean all areas the individual has used often Clean all touchable surfaces, such as counters, tabletops, doorknobs, bathroom fixtures, toilets, phones, keyboards, tablets, and bedside tables, every day. Also, clean any surfaces that may have blood, body fluids, and/or secretions or excretions on them. Wear gloves when cleaning surfaces the patient has come in contact with. Use a diluted bleach solution (e.g., dilute bleach with 1 part bleach and 10 parts water) or a household disinfectant with a label that says EPA-registered for coronaviruses. To make a bleach solution  at home, add 1 tablespoon of bleach to 1 quart (4 cups) of water. For a larger supply, add  cup of bleach to 1 gallon (16 cups) of water. Read labels of cleaning products and follow recommendations provided on product labels. Labels contain instructions for safe and effective use of the cleaning product including precautions you should take when applying the product, such as wearing gloves or eye protection and making sure you  have good ventilation during use of the product. Remove gloves and wash hands immediately after cleaning.  Monitor yourself for signs and symptoms of illness Caregivers and household members are considered close contacts, should monitor their health, and will be asked to limit movement outside of the home to the extent possible. Follow the monitoring steps for close contacts listed on the symptom monitoring form.   ? If you have additional questions, contact your local health department or call the epidemiologist on call at [phone] (available 24/7). ? This guidance is subject to change. For the most up-to-date guidance from [facility], please refer to their website: [email or web address]

## 2019-05-13 NOTE — ED Triage Notes (Signed)
Pt w c/o sore throat, cough, and faver since Sun; pt states that she had teeth extracted on Fri; pt denies taking any meds for symptoms

## 2019-05-13 NOTE — ED Triage Notes (Signed)
Pt in with cough, sore throat x 5 days with no improvement. Also reports intermittent fevers

## 2019-05-17 NOTE — ED Provider Notes (Signed)
[facility] EMERGENCY DEPART[facility] Provider Note   CSN: [id number] Arrival date & time: 05/13/19  0865     History   Chief Complaint Chief Complaint  Patient presents with  . Cough  . Chest Pain    HPI Tammy Rojas is a 33 y.o. female.      Spanish-speaking.  Interpreter used.  33 year old female with cough, fever, sore throat and some shortness of breath.  Onset Sunday.  Persistent since then.  2 days prior she had teeth extracted.  She is still having some pain and swelling in her neck but this has been progressively improving.  No acute abdominal pain.  No vomiting or diarrhea.  No urinary complaints.  No sick contacts that she is aware of.  Past Medical History:  Diagnosis Date  . No pertinent past medical history     Patient Active Problem List   Diagnosis Date Noted  . Acute appendicitis 06/07/2018    Past Surgical History:  Procedure Laterality Date  . CESAREAN SECTION    . LAPAROSCOPIC APPENDECTOMY N/A 06/07/2018   Procedure: APPENDECTOMY LAPAROSCOPIC;  Surgeon: Judeth Horn, MD;  Location: [facility];  Service: General;  Laterality: N/A;     OB History    Gravida  3   Para  3   Term  3   Preterm  0   AB  0   Living  3     SAB  0   TAB  0   Ectopic  0   Multiple  0   Live Births  1            Home Medications    Prior to Admission medications   Medication Sig Start Date End Date Taking? Authorizing Provider  azithromycin (ZITHROMAX) 250 MG tablet Take 1 tablet (250 mg total) by mouth daily. Take first 2 tablets together, then 1 every day until finished. 05/13/19   Virgel Manifold, MD  oxyCODONE (OXY IR/ROXICODONE) 5 MG immediate release tablet Take 1 tablet (5 mg total) by mouth every 6 (six) hours as needed for moderate pain. 06/08/18   Excell Seltzer, MD  Prenatal Vit-Fe Fumarate-FA (PRENATAL MULTIVITAMIN) TABS Take 1 tablet by mouth daily.    [provider]    Family History Family History   Problem Relation Age of Onset  . Early death Mother   . Diabetes Father     Social History Social History   Tobacco Use  . Smoking status: Never Smoker  . Smokeless tobacco: Never Used  Substance Use Topics  . Alcohol use: No  . Drug use: No     Allergies   Patient has no known allergies.   Review of Systems Review of Systems All systems reviewed and negative, other than as noted in HPI.  Physical Exam Updated Vital Signs BP 120/79   Pulse 74   Temp 99.9 F (37.7 C) (Oral)   Resp (!) 28   SpO2 99%   Physical Exam Vitals signs and nursing note reviewed.  Constitutional:      General: She is not in acute distress.    Appearance: She is well-developed.  HENT:     Head: Normocephalic and atraumatic.  Eyes:     General:        Right eye: No discharge.        Left eye: No discharge.     Conjunctiva/sclera: Conjunctivae normal.  Neck:     Musculoskeletal: Neck supple.  Cardiovascular:     Rate and Rhythm:  Normal rate and regular rhythm.     Heart sounds: Normal heart sounds. No murmur. No friction rub. No gallop.   Pulmonary:     Effort: Pulmonary effort is normal. No respiratory distress.     Breath sounds: Normal breath sounds.     Comments: Mild tachypnea. Abdominal:     General: There is no distension.     Palpations: Abdomen is soft.     Tenderness: There is no abdominal tenderness.  Musculoskeletal:        General: No tenderness.  Skin:    General: Skin is warm and dry.  Neurological:     Mental Status: She is alert.  Psychiatric:        Behavior: Behavior normal.        Thought Content: Thought content normal.      ED Treatments / Results  Labs (all labs ordered are listed, but only abnormal results are displayed) Labs Reviewed  SARS CORONAVIRUS 2 (HOSPITAL ORDER, [facility]) - Abnormal; Notable for the following components:      Result Value   SARS Coronavirus 2 POSITIVE (*)    All other components within  normal limits  CBC WITH DIFFERENTIAL/PLATELET - Abnormal; Notable for the following components:   WBC 10.8 (*)    Neutro Abs 8.9 (*)    All other components within normal limits  BASIC METABOLIC PANEL - Abnormal; Notable for the following components:   Glucose, Bld 111 (*)    BUN 5 (*)    Calcium 8.5 (*)    All other components within normal limits  PREGNANCY, URINE    EKG EKG Interpretation  Date/Time:  Wednesday May 13 2019 07:55:19 EDT Ventricular Rate:  96 PR Interval:    QRS Duration: 82 QT Interval:  338 QTC Calculation: 428 R Axis:   67 Text Interpretation:  Sinus rhythm RSR' in V1 or V2, probably normal variant Confirmed by Virgel Manifold [phone]) on 05/13/2019 8:21:04 AM Also confirmed by Virgel Manifold [phone]), editor Oswaldo Milian, [place] ([id number])  on 05/13/2019 3:48:09 PM   Radiology No results found.   Dg Chest Portable 1 View  Result Date: 05/13/2019 CLINICAL DATA:  Fever, chest pain EXAM: PORTABLE CHEST 1 VIEW COMPARISON:  01/01/2019 FINDINGS: Bilateral lower lobe airspace disease. No pleural effusion or pneumothorax. Stable cardiomediastinal silhouette. No aggressive osseous lesion. IMPRESSION: Bilateral lower lobe airspace disease most concerning for pneumonia. Electronically Signed   By: Kathreen Devoid   On: 05/13/2019 08:42    Procedures Procedures (including critical care time)  Medications Ordered in ED Medications  ibuprofen (ADVIL) tablet 600 mg (600 mg Oral Given 05/13/19 0830)  sodium chloride 0.9 % bolus 1,000 mL (0 mLs Intravenous Stopped 05/13/19 1244)  morphine 4 MG/ML injection 4 mg (4 mg Intravenous Given 05/13/19 0854)     Initial Impression / Assessment and Plan / ED Course  I have reviewed the triage vital signs and the nursing notes.  Pertinent labs & imaging results that were available during my care of the patient were reviewed by me and considered in my medical decision making (see chart for details).     33 year old female with COVID.   Chest x-ray noted.  This is likely from [place] as opposed to bacterial pneumonia.  Will place on azithromycin though.  She is somewhat tachypneic but otherwise does not appear to have any acute respiratory distress.  She is otherwise presumably healthy.  Oxygen saturations are okay on room air.  At this time I  think she is appropriate for discharge.  Strict return precautions were discussed.  Interpreter service was again utilized at the time of discharge to go over her instructions.   Tammy Rojas was evaluated in Emergency Department on 05/17/2019 for the symptoms described in the history of present illness. She was evaluated in the context of the global COVID-19 pandemic, which necessitated consideration that the patient might be at risk for infection with the SARS-CoV-2 virus that causes COVID-19. Institutional protocols and algorithms that pertain to the evaluation of patients at risk for COVID-19 are in a state of rapid change based on information released by regulatory bodies including the CDC and federal and state organizations. These policies and algorithms were followed during the patient's care in the ED.   Final Clinical Impressions(s) / ED Diagnoses   Final diagnoses:  [id number]    ED Discharge Orders         Ordered    azithromycin (ZITHROMAX) 250 MG tablet  Daily     05/13/19 1159           Virgel Manifold, MD 05/17/19 1909

## 2022-06-26 ENCOUNTER — Inpatient Hospital Stay (HOSPITAL_COMMUNITY): Payer: No Typology Code available for payment source

## 2022-06-26 ENCOUNTER — Encounter (HOSPITAL_COMMUNITY): Payer: Self-pay | Admitting: Obstetrics and Gynecology

## 2022-06-26 ENCOUNTER — Inpatient Hospital Stay (HOSPITAL_COMMUNITY)
Admission: AD | Admit: 2022-06-26 | Discharge: 2022-06-26 | Disposition: A | Discharge status: Home / Self Care | Payer: No Typology Code available for payment source | Attending: Obstetrics and Gynecology | Admitting: Obstetrics and Gynecology

## 2022-06-26 DIAGNOSIS — O3680X0 Pregnancy with inconclusive fetal viability, not applicable or unspecified: Secondary | ICD-10-CM | POA: Insufficient documentation

## 2022-06-26 DIAGNOSIS — Z3A01 Less than 8 weeks gestation of pregnancy: Secondary | ICD-10-CM | POA: Insufficient documentation

## 2022-06-26 DIAGNOSIS — M545 Low back pain, unspecified: Secondary | ICD-10-CM | POA: Insufficient documentation

## 2022-06-26 DIAGNOSIS — R109 Unspecified abdominal pain: Secondary | ICD-10-CM | POA: Insufficient documentation

## 2022-06-26 DIAGNOSIS — O3411 Maternal care for benign tumor of corpus uteri, first trimester: Secondary | ICD-10-CM | POA: Insufficient documentation

## 2022-06-26 DIAGNOSIS — O209 Hemorrhage in early pregnancy, unspecified: Secondary | ICD-10-CM

## 2022-06-26 DIAGNOSIS — O26891 Other specified pregnancy related conditions, first trimester: Secondary | ICD-10-CM | POA: Insufficient documentation

## 2022-06-26 DIAGNOSIS — O26851 Spotting complicating pregnancy, first trimester: Secondary | ICD-10-CM | POA: Insufficient documentation

## 2022-06-26 DIAGNOSIS — D251 Intramural leiomyoma of uterus: Secondary | ICD-10-CM | POA: Insufficient documentation

## 2022-06-26 LAB — CBC
HCT: 37.6 % (ref 36.0–46.0)
Hemoglobin: 13 g/dL (ref 12.0–15.0)
MCH: 31.4 pg (ref 26.0–34.0)
MCHC: 34.6 g/dL (ref 30.0–36.0)
MCV: 90.8 fL (ref 80.0–100.0)
Platelets: 317 10*3/uL (ref 150–400)
RBC: 4.14 MIL/uL (ref 3.87–5.11)
RDW: 11.9 % (ref 11.5–15.5)
WBC: 9.7 10*3/uL (ref 4.0–10.5)
nRBC: 0 % (ref 0.0–0.2)

## 2022-06-26 LAB — WET PREP, GENITAL
Clue Cells Wet Prep HPF POC: NONE SEEN
Sperm: NONE SEEN
Trich, Wet Prep: NONE SEEN
WBC, Wet Prep HPF POC: <10 (ref <10)
Yeast Wet Prep HPF POC: NONE SEEN

## 2022-06-26 LAB — COMPREHENSIVE METABOLIC PANEL
ALT: 37 U/L (ref 0–44)
AST: 28 U/L (ref 15–41)
Albumin: 3.9 g/dL (ref 3.5–5.0)
Alkaline Phosphatase: 82 U/L (ref 38–126)
Anion gap: 7 (ref 5–15)
BUN: 14 mg/dL (ref 6–20)
CO2: 23 mmol/L (ref 22–32)
Calcium: 9 mg/dL (ref 8.9–10.3)
Chloride: 108 mmol/L (ref 98–111)
Creatinine, Ser: 0.9 mg/dL (ref 0.44–1.00)
GFR, Estimated: >60 mL/min (ref >60)
Glucose, Bld: 112 mg/dL — ABNORMAL HIGH (ref 70–99)
Potassium: 3.8 mmol/L (ref 3.5–5.1)
Sodium: 138 mmol/L (ref 135–145)
Total Bilirubin: 0.4 mg/dL (ref 0.3–1.2)
Total Protein: 6.9 g/dL (ref 6.5–8.1)

## 2022-06-26 LAB — POCT PREGNANCY, URINE: Preg Test, Ur: POSITIVE — AB

## 2022-06-26 LAB — ABO/RH: ABO/RH(D): O POS

## 2022-06-26 NOTE — MAU Note (Signed)
.  Tammy Rojas is a 36 y.o. at Unknown here in MAU reporting spotting since Friday and mild back pain.  LMP: 05/11/22 Onset of complaint: Friday Pain score: 1 Vitals:   06/26/22 2115 06/26/22 2117  BP:  129/75  Pulse: 69   Resp: 18   Temp: 97.9 F (36.6 C)   SpO2: 100%      FHT:n/a Lab orders placed from triage:  none

## 2022-06-26 NOTE — MAU Provider Note (Signed)
Chief Complaint: No chief complaint on file.   Event Date/Time   First Provider Initiated Contact with Patient 06/26/22 2127        SUBJECTIVE HPI: Tammy Rojas is a 36 y.o. G4P3003 at 60w4dby LMP who presents to maternity admissions reporting vaginal spotting since Friday and mild back pain.  Bleeding did not soak pad.. She denies vaginal itching/burning, urinary symptoms, h/a, dizziness, n/v, or fever/chills.    Abdominal Pain This is a recurrent problem. The current episode started in the past 7 days. The onset quality is gradual. The problem occurs intermittently. The problem has been unchanged. The pain is at a severity of 1/10. The quality of the pain is cramping. The abdominal pain radiates to the back. Pertinent negatives include no fever. Nothing aggravates the pain. The pain is relieved by Nothing. She has tried nothing for the symptoms.  Vaginal Bleeding The patient's primary symptoms include vaginal bleeding. The patient's pertinent negatives include no genital itching or genital odor. This is a recurrent problem. The current episode started in the past 7 days. She is pregnant. Associated symptoms include abdominal pain. Pertinent negatives include no chills or fever. The vaginal discharge was bloody. The vaginal bleeding is spotting. She has not been passing clots. She has not been passing tissue. Nothing aggravates the symptoms. She has tried nothing for the symptoms.   RN Note: Tammy Durrettis a 36y.o. at Unknown here in MAU reporting spotting since Friday and mild back pain.  LMP: 05/11/22 Onset of complaint: Friday Pain score: 1  Past Medical History:  Diagnosis Date   No pertinent past medical history    Past Surgical History:  Procedure Laterality Date   CESAREAN SECTION     LAPAROSCOPIC APPENDECTOMY N/A 06/07/2018   Procedure: APPENDECTOMY LAPAROSCOPIC;  Surgeon: WJudeth Horn MD;  [facility]  Service: General;  Laterality: N/A;   Social History    Socioeconomic History   Marital status: Single    Spouse name: Not on file   Number of children: Not on file   Years of education: Not on file   Highest education level: Not on file  Occupational History   Not on file  Tobacco Use   Smoking status: Never   Smokeless tobacco: Never  Substance and Sexual Activity   Alcohol use: No   Drug use: No   Sexual activity: Yes  Other Topics Concern   Not on file  Social History Narrative   Not on file   Social Determinants of Health   Financial Resource Strain: Not on file  Food Insecurity: Not on file  Transportation Needs: Not on file  Physical Activity: Not on file  Stress: Not on file  Social Connections: Not on file  Intimate Partner Violence: Not on file   No current facility-administered medications on file prior to encounter.   Current Outpatient Medications on File Prior to Encounter  Medication Sig Dispense Refill   azithromycin (ZITHROMAX) 250 MG tablet Take 1 tablet (250 mg total) by mouth daily. Take first 2 tablets together, then 1 every day until finished. 6 tablet 0   oxyCODONE (OXY IR/ROXICODONE) 5 MG immediate release tablet Take 1 tablet (5 mg total) by mouth every 6 (six) hours as needed for moderate pain. 10 tablet 0   Prenatal Vit-Fe Fumarate-FA (PRENATAL MULTIVITAMIN) TABS Take 1 tablet by mouth daily.     No Known Allergies  I have reviewed patient's Past Medical Hx, Surgical Hx, Family Hx, Social Hx, medications and allergies.  ROS:  Review of Systems  Constitutional:  Negative for chills and fever.  Respiratory:  Negative for shortness of breath.   Gastrointestinal:  Positive for abdominal pain.  Genitourinary:  Positive for vaginal bleeding.   Review of Systems  Other systems negative   Physical Exam  Physical Exam Patient Vitals for the past 24 hrs:  BP Temp Pulse Resp SpO2 Height Weight  06/26/22 2117 129/75 -- -- -- -- -- --  06/26/22 2115 -- 97.9 F (36.6 C) 69 18 100 % '4\' 11"'$  (1.499  m) 64.9 kg   Constitutional: Well-developed, well-nourished female in no acute distress.  Cardiovascular: normal rate Respiratory: normal effort GI: Abd soft, non-tender.  MS: Extremities nontender, no edema, normal ROM Neurologic: Alert and oriented x 4.  GU: Neg CVAT.  PELVIC EXAM: deferred in lieu of transvaginal ultrasound  LAB RESULTS Results for orders placed or performed during the hospital encounter of 06/26/22 (from the past 24 hour(s))  Pregnancy, urine POC     Status: Abnormal   Collection Time: 06/26/22  8:55 PM  Result Value Ref Range   Preg Test, Ur POSITIVE (A) NEGATIVE  Wet prep, genital     Status: None   Collection Time: 06/26/22 10:00 PM   Specimen: Vaginal  Result Value Ref Range   Yeast Wet Prep HPF POC NONE SEEN NONE SEEN   Trich, Wet Prep NONE SEEN NONE SEEN   Clue Cells Wet Prep HPF POC NONE SEEN NONE SEEN   WBC, Wet Prep HPF POC <10 <10   Sperm NONE SEEN   ABO/Rh     Status: None   Collection Time: 06/26/22 10:03 PM  Result Value Ref Range   ABO/RH(D) O POS    No rh immune globuloin      NOT A RH IMMUNE GLOBULIN CANDIDATE, PT RH POSITIVE Performed at [facility], [place] [address], [place], [place] [address]   CBC     Status: None   Collection Time: 06/26/22 10:03 PM  Result Value Ref Range   WBC 9.7 4.0 - 10.5 K/uL   RBC 4.14 3.87 - 5.11 MIL/uL   Hemoglobin 13.0 12.0 - 15.0 g/dL   HCT 37.6 36.0 - 46.0 %   MCV 90.8 80.0 - 100.0 fL   MCH 31.4 26.0 - 34.0 pg   MCHC 34.6 30.0 - 36.0 g/dL   RDW 11.9 11.5 - 15.5 %   Platelets 317 150 - 400 K/uL   nRBC 0.0 0.0 - 0.2 %  Comprehensive metabolic panel     Status: Abnormal   Collection Time: 06/26/22 10:03 PM  Result Value Ref Range   Sodium 138 135 - 145 mmol/L   Potassium 3.8 3.5 - 5.1 mmol/L   Chloride 108 98 - 111 mmol/L   CO2 23 22 - 32 mmol/L   Glucose, Bld 112 (H) 70 - 99 mg/dL   BUN 14 6 - 20 mg/dL   Creatinine, Ser 0.90 0.44 - 1.00 mg/dL   Calcium 9.0 8.9 - 10.3 mg/dL   Total  Protein 6.9 6.5 - 8.1 g/dL   Albumin 3.9 3.5 - 5.0 g/dL   AST 28 15 - 41 U/L   ALT 37 0 - 44 U/L   Alkaline Phosphatase 82 38 - 126 U/L   Total Bilirubin 0.4 0.3 - 1.2 mg/dL   GFR, Estimated >60 >60 mL/min   Anion gap 7 5 - 15  hCG, quantitative, pregnancy     Status: Abnormal   Collection Time: 06/26/22 10:03  PM  Result Value Ref Range   hCG, Beta Chain, Quant, S 3,591 (H) <5 mIU/mL     IMAGING US OB LESS THAN 14 WEEKS WITH OB TRANSVAGINAL  Result Date: 06/26/2022 CLINICAL DATA:  Pregnant patient with spotting. Pain. Beta hCG not provided. LMP 05/11/2022 EXAM: OBSTETRIC <14 WK [place] AND TRANSVAGINAL OB US TECHNIQUE: Both transabdominal and transvaginal ultrasound examinations were performed for complete evaluation of the gestation as well as the maternal uterus, adnexal regions, and pelvic cul-de-sac. Transvaginal technique was performed to assess early pregnancy. COMPARISON:  None Available. FINDINGS: Intrauterine gestational sac: None Yolk sac:  Not Visualized. Embryo:  Not Visualized. Subchorionic hemorrhage:  Not applicable. Maternal uterus/adnexae: The uterus is anteverted. There is a 1.9 cm intramural right fundal fibroid. No intrauterine gestational sac. Endometrial thickness of 12 mm. Both ovaries are visualized and are normal. No adnexal mass. No pelvic free fluid. IMPRESSION: No intrauterine pregnancy or findings suspicious for ectopic pregnancy. Findings are consistent with pregnancy of unknown location and may reflect early intrauterine pregnancy not yet visualized sonographically, occult ectopic pregnancy, or failed pregnancy. Recommend trending of beta HCG and sonographic follow-up in 7-10 days as clinically indicated. Electronically Signed   By: Keith Rake M.D.   On: 06/26/2022 22:59     MAU Management/MDM: I have reviewed the triage vital signs and the nursing notes.   Pertinent labs & imaging results that were available during my care of the patient were reviewed by me and  considered in my medical decision making (see chart for details).      I have reviewed her medical records including past results, notes and treatments.   Ordered usual first trimester r/o ectopic labs.   Pelvic cultures done Will check baseline Ultrasound to rule out ectopic.  This bleeding/pain can represent a normal pregnancy with bleeding, spontaneous abortion or even an ectopic which can be life-threatening.  The process as listed above helps to determine which of these is present.  Discussed we cannot identify source of bleeding or status of pregnancy just yet.  Since nothing seen on [place] this could represent an ectopic, SAB or very early pregnancy Recommend repeat HCG on Friday (appt made)   ASSESSMENT Pregnancy at 60w5dby LMP Spotting Abdominal and low back pain Pregnancy of unknown location  PLAN Discharge home Plan to repeat HCG level in 48 hours in clinic at 1035am Friday Will repeat  Ultrasound in about 7-10 days if HCG levels double appropriately  Ectopic precautions  Pt stable at time of discharge. Encouraged to return here if she develops worsening of symptoms, increase in pain, fever, or other concerning symptoms.    MHansel FeinsteinCNM, MSN Certified Nurse-Midwife 06/26/2022  9:27 PM

## 2022-06-27 ENCOUNTER — Inpatient Hospital Stay (HOSPITAL_COMMUNITY): Payer: No Typology Code available for payment source

## 2022-06-27 ENCOUNTER — Inpatient Hospital Stay (HOSPITAL_COMMUNITY)
Admission: AD | Admit: 2022-06-27 | Discharge: 2022-06-28 | Disposition: A | Discharge status: Home / Self Care | Payer: No Typology Code available for payment source | Attending: Obstetrics and Gynecology | Admitting: Obstetrics and Gynecology

## 2022-06-27 DIAGNOSIS — O26891 Other specified pregnancy related conditions, first trimester: Secondary | ICD-10-CM | POA: Insufficient documentation

## 2022-06-27 DIAGNOSIS — Z6790 Unspecified blood type, Rh positive: Secondary | ICD-10-CM

## 2022-06-27 DIAGNOSIS — Z3A01 Less than 8 weeks gestation of pregnancy: Secondary | ICD-10-CM | POA: Insufficient documentation

## 2022-06-27 DIAGNOSIS — O209 Hemorrhage in early pregnancy, unspecified: Secondary | ICD-10-CM | POA: Insufficient documentation

## 2022-06-27 DIAGNOSIS — O3680X0 Pregnancy with inconclusive fetal viability, not applicable or unspecified: Secondary | ICD-10-CM

## 2022-06-27 LAB — CBC
HCT: 39 % (ref 36.0–46.0)
Hemoglobin: 13.6 g/dL (ref 12.0–15.0)
MCH: 31.4 pg (ref 26.0–34.0)
MCHC: 34.9 g/dL (ref 30.0–36.0)
MCV: 90.1 fL (ref 80.0–100.0)
Platelets: 341 10*3/uL (ref 150–400)
RBC: 4.33 MIL/uL (ref 3.87–5.11)
RDW: 11.9 % (ref 11.5–15.5)
WBC: 10 10*3/uL (ref 4.0–10.5)
nRBC: 0 % (ref 0.0–0.2)

## 2022-06-27 LAB — GC/CHLAMYDIA PROBE AMP (~~LOC~~) NOT AT ARMC
Chlamydia: NEGATIVE
Comment: NEGATIVE
Comment: NORMAL
Neisseria Gonorrhea: NEGATIVE

## 2022-06-27 LAB — HCG, QUANTITATIVE, PREGNANCY
hCG, Beta Chain, Quant, S: 3591 m[IU]/mL — ABNORMAL HIGH (ref <5)
hCG, Beta Chain, Quant, S: 4464 m[IU]/mL — ABNORMAL HIGH (ref <5)

## 2022-06-27 NOTE — MAU Note (Signed)
..  Tammy Rojas is a 36 y.o. at 75w5dhere in MAU reporting: Was here yesterday for labs.  Since Friday she has had some spotting when she wipes, reports around 1pm she had a lot more bleeding with clots. Has put on a pad, reports there is minimal bleeding on the pad but when she goes to the restroom she sees clots in the toilet with more blood.  Reports lower abdominal cramping.   Pain score: 2/10 Vitals:   06/27/22 2019  BP: 129/70  Pulse: 65  Resp: 16  Temp: 98 F (36.7 C)  SpO2: 100%      Lab orders placed from triage:  none

## 2022-06-28 DIAGNOSIS — Z3A01 Less than 8 weeks gestation of pregnancy: Secondary | ICD-10-CM

## 2022-06-28 DIAGNOSIS — O209 Hemorrhage in early pregnancy, unspecified: Secondary | ICD-10-CM

## 2022-06-28 DIAGNOSIS — O3680X0 Pregnancy with inconclusive fetal viability, not applicable or unspecified: Secondary | ICD-10-CM

## 2022-06-28 NOTE — Discharge Instructions (Signed)
[place] for [employer] at [employer] [facility]             [address] [place], [place] [address] [place] for [facility] at [place], [place], [place], [place], [address] [phone]  Center for [facility] at [place], [place], [place], [place], [address] [phone]  Center for [facility] at [facility] [address] [place], [place], [place], [address] [phone]  Center for [facility] at [facility] [employer] [facility]                                 [place], [place], [place], [address] [phone]  Center for [facility] at [facility]                                    [address], [place], [place], [address] [place] for [place] at [facility] [address] [place], [place], [place], [address]                              [facility] [place], [place], [place], [place], [address] [phone]  [facility]         Phone: [phone]  [facility]      Phone: [phone]   [facility]      Phone: [place] [facility]         Phone: [phone]   [place] Department-Maternity    Phone: [place]      Phone: [phone]  [facility]     Phone: [phone]  [facility]        Phone: [phone]  [facility]) [phone]  [facility]      Phone: [phone]

## 2022-06-29 ENCOUNTER — Other Ambulatory Visit: Payer: Self-pay

## 2022-06-29 ENCOUNTER — Ambulatory Visit (INDEPENDENT_AMBULATORY_CARE_PROVIDER_SITE_OTHER): Payer: Self-pay | Admitting: General Practice

## 2022-06-29 VITALS — BP 120/76 | HR 71 | Ht 59.0 in | Wt 141.0 lb

## 2022-06-29 DIAGNOSIS — O3680X0 Pregnancy with inconclusive fetal viability, not applicable or unspecified: Secondary | ICD-10-CM

## 2022-06-29 LAB — BETA HCG QUANT (REF LAB): hCG Quant: 3067 m[IU]/mL

## 2022-06-29 NOTE — MAU Provider Note (Signed)
Chief Complaint: Vaginal Bleeding   None     SUBJECTIVE HPI: Tammy Rojas is a 36 y.o. G4P3003 at 31w0dby LMP who presents to maternity admissions reporting increased vaginal bleeding since her MAU visit on 06/26/22. She presented to MAU on 06/26/22 with spotting and mild back pain. She had hcg of 3591 and UKoreawithout gestational sac, yolk sac, or fetal pole.  [facility]  She presented tonight because her bleeding became bright red and heavier than before, and is requiring but not soaking a pad. She denies any pain.   HPI  Past Medical History:  Diagnosis Date   No pertinent past medical history    Past Surgical History:  Procedure Laterality Date   CESAREAN SECTION     LAPAROSCOPIC APPENDECTOMY N/A 06/07/2018   Procedure: APPENDECTOMY LAPAROSCOPIC;  Surgeon: WJudeth Horn MD;  Location[facility]  Service: General;  Laterality: N/A;   Social History   Socioeconomic History   Marital status: Single    Spouse name: Not on file   Number of children: Not on file   Years of education: Not on file   Highest education level: Not on file  Occupational History   Not on file  Tobacco Use   Smoking status: Never   Smokeless tobacco: Never  Substance and Sexual Activity   Alcohol use: No   Drug use: No   Sexual activity: Yes  Other Topics Concern   Not on file  Social History Narrative   Not on file   Social Determinants of Health   Financial Resource Strain: Not on file  Food Insecurity: Not on file  Transportation Needs: Not on file  Physical Activity: Not on file  Stress: Not on file  Social Connections: Not on file  Intimate Partner Violence: Not on file   No current facility-administered medications on file prior to encounter.   Current Outpatient Medications on File Prior to Encounter  Medication Sig Dispense Refill   Prenatal Vit-Fe Fumarate-FA (PRENATAL MULTIVITAMIN) TABS Take 1 tablet by mouth daily.     No  Known Allergies  ROS:  Review of Systems  Constitutional:  Negative for chills, fatigue and fever.  Respiratory:  Negative for shortness of breath.   Cardiovascular:  Negative for chest pain.  Gastrointestinal:  Negative for nausea and vomiting.  Genitourinary:  Positive for vaginal bleeding. Negative for difficulty urinating, dysuria, flank pain, pelvic pain, vaginal discharge and vaginal pain.  Neurological:  Negative for dizziness and headaches.  Psychiatric/Behavioral: Negative.       I have reviewed patient's Past Medical Hx, Surgical Hx, Family Hx, Social Hx, medications and allergies.   Physical Exam  BP 129/70 (BP Location: Right Arm)   Pulse 65   Temp 98 F (36.7 C) (Oral)   Resp 16   Ht '4\' 11"'$  (1.499 m)   Wt 64.9 kg   LMP 05/11/2022   SpO2 100%   BMI 28.90 kg/m    Constitutional: Well-developed, well-nourished female in no acute distress.  Cardiovascular: normal rate Respiratory: normal effort GI: Abd soft, non-tender. Pos BS x 4 MS: Extremities nontender, no edema, normal ROM Neurologic: Alert and oriented x 4.  GU: Neg CVAT.  PELVIC EXAM: Deferred    LAB RESULTS No results found for this or any previous visit (from the past 24 hour(s)).  --/--/O POS (08/08 2203)  IMAGING [place]OB Transvaginal  Result Date: 06/28/2022 CLINICAL DATA:  Spotting EXAM: TRANSVAGINAL OB ULTRASOUND  TECHNIQUE: Transvaginal ultrasound was performed for complete evaluation of the gestation as well as the maternal uterus, adnexal regions, and pelvic cul-de-sac. COMPARISON:  None Available. FINDINGS: Intrauterine gestational sac: Small fluid collection noted within the endometrium which may reflect early gestational sac. Yolk sac:  Not Visualized. Embryo:  Not Visualized. Cardiac Activity: Not Visualized. Heart Rate:  bpm MSD: 2.4 mm   4 w   6 d CRL:     mm    w  d                  [place] EDC: Subchorionic hemorrhage:  None visualized. Maternal uterus/adnexae: No adnexal mass or free fluid.  2.4 cm posterior fundal fibroid. IMPRESSION: Probable early intrauterine gestational sac, but no yolk sac, fetal pole, or cardiac activity yet visualized. Recommend follow-up quantitative B-HCG levels and follow-up US in 14 days to assess viability. This recommendation follows SRU consensus guidelines: Diagnostic Criteria for Nonviable Pregnancy Early in the First Trimester. [place] Med 2013; 262:0355-97. Electronically Signed   By: Rolm Baptise M.D.   On: 06/28/2022 00:10   US OB LESS THAN 14 WEEKS WITH OB TRANSVAGINAL  Result Date: 06/26/2022 CLINICAL DATA:  Pregnant patient with spotting. Pain. Beta hCG not provided. LMP 05/11/2022 EXAM: OBSTETRIC <14 WK [place] AND TRANSVAGINAL OB US TECHNIQUE: Both transabdominal and transvaginal ultrasound examinations were performed for complete evaluation of the gestation as well as the maternal uterus, adnexal regions, and pelvic cul-de-sac. Transvaginal technique was performed to assess early pregnancy. COMPARISON:  None Available. FINDINGS: Intrauterine gestational sac: None Yolk sac:  Not Visualized. Embryo:  Not Visualized. Subchorionic hemorrhage:  Not applicable. Maternal uterus/adnexae: The uterus is anteverted. There is a 1.9 cm intramural right fundal fibroid. No intrauterine gestational sac. Endometrial thickness of 12 mm. Both ovaries are visualized and are normal. No adnexal mass. No pelvic free fluid. IMPRESSION: No intrauterine pregnancy or findings suspicious for ectopic pregnancy. Findings are consistent with pregnancy of unknown location and may reflect early intrauterine pregnancy not yet visualized sonographically, occult ectopic pregnancy, or failed pregnancy. Recommend trending of beta HCG and sonographic follow-up in 7-10 days as clinically indicated. Electronically Signed   By: Keith Rake M.D.   On: 06/26/2022 22:59    MAU Management/MDM: Orders Placed This Encounter  Procedures   US OB Transvaginal   hCG, quantitative, pregnancy   CBC    Discharge patient    No orders of the defined types were placed in this encounter.   Possible gestational sac seen on [place] that was not visible on [place] 06/26/22.  Hcg with rise, difficult to assess appropriate rise at 24 hours.  Consult Dr Ilda Basset with assessment and findings. Pt to keep lab appt on 06/29/22 for follow up hcg. Return/ectopic precautions reviewed with pt.  Spanish interpreter on video used for all communication.   ASSESSMENT 1. Pregnancy of unknown anatomic location   2. Vaginal bleeding in pregnancy, first trimester   3. [redacted] weeks gestation of pregnancy   4. Blood type, Rh positive      PLAN Discharge home Allergies as of 06/28/2022   No Known Allergies      Medication List     TAKE these medications    prenatal multivitamin Tabs tablet Take 1 tablet by mouth daily.        Follow-up Information     prenatal provider of your choice Follow up.   Why: Regresar a MAU segn sea necesario para emergencia  Fatima Blank Certified Nurse-Midwife 06/29/2022  2:48 AM

## 2022-06-29 NOTE — Progress Notes (Signed)
Beta HCG Follow-up Visit  [facility] beta [facility]CG lab. She was seen in MAU for  vaginal spotting/bleeding and lower back pain  on August 8 & August 9. Patient reports  light vaginal bleeding/spotting  today. She does report some mild cramping. Discussed with patient that we are following beta HCG levels today. Results will be back in approximately 2 hours. Valid contact number for patient confirmed. We will call the patient with results.   Beta HCG results:          8/8         3591           8/9         4464       Derinda Late 06/29/2022 10:45 AM

## 2022-06-30 ENCOUNTER — Encounter (HOSPITAL_COMMUNITY): Payer: Self-pay | Admitting: Obstetrics and Gynecology

## 2022-06-30 ENCOUNTER — Inpatient Hospital Stay (HOSPITAL_COMMUNITY)
Admission: AD | Admit: 2022-06-30 | Discharge: 2022-06-30 | Disposition: A | Discharge status: Home / Self Care | Payer: No Typology Code available for payment source | Attending: Obstetrics and Gynecology | Admitting: Obstetrics and Gynecology

## 2022-06-30 ENCOUNTER — Other Ambulatory Visit: Payer: Self-pay

## 2022-06-30 DIAGNOSIS — Z6790 Unspecified blood type, Rh positive: Secondary | ICD-10-CM

## 2022-06-30 DIAGNOSIS — O039 Complete or unspecified spontaneous abortion without complication: Secondary | ICD-10-CM

## 2022-06-30 HISTORY — DX: Other specified health status: Z78.9

## 2022-06-30 NOTE — MAU Provider Note (Signed)
History     CSN: [id number]  Arrival date and time: 06/30/22 1055   Event Date/Time   First Provider Initiated Contact with Patient 06/30/22 1205      Chief Complaint  Patient presents with   Vaginal Bleeding   Abdominal Pain   [name] is a 36 y.o. [id number] at 49w1dwho presents to MAU for vaginal bleeding which began last Friday. Patient has been recently followed for trending hCG and was last seen at the office on Friday, but did not get a call that day with her results. Patient presents today because bleeding and cramping have increased and she is passing clots (small in size per report by RN who witness clot in toilet) and cramping is 6/10 per pt report. Patient states that her periods are very light and she uses only 2-3 pads on her heaviest day and does not experience any cramping with her periods, so this is very abnormal for her.  Of note, patient resting comfortably in bed and able to converse without difficulty.  Spanish interpreter used for entire visit.  Pt denies vaginal discharge/odor/itching. Pt denies N/V, constipation, diarrhea, or urinary problems. Pt denies fever, chills, fatigue, sweating or changes in appetite. Pt denies SOB or chest pain. Pt denies dizziness, HA, light-headedness, weakness.    OB History     Gravida  4   Para  3   Term  3   Preterm  0   AB  0   Living  3      SAB  0   IAB  0   Ectopic  0   Multiple  0   Live Births  1           Past Medical History:  Diagnosis Date   Medical history non-contributory    No pertinent past medical history     Past Surgical History:  Procedure Laterality Date   CESAREAN SECTION     LAPAROSCOPIC APPENDECTOMY N/A 06/07/2018   Procedure: APPENDECTOMY LAPAROSCOPIC;  Surgeon: WJudeth Horn MD;  [facility]: [facility]  Service: General;  Laterality: N/A;    Family History  Problem Relation Age of Onset   Early death Mother    Diabetes Father     Social History    Tobacco Use   Smoking status: Never   Smokeless tobacco: Never  Vaping Use   Vaping Use: Never used  Substance Use Topics   Alcohol use: No   Drug use: No    Allergies: No Known Allergies  Medications Prior to Admission  Medication Sig Dispense Refill Last Dose   Prenatal Vit-Fe Fumarate-FA (PRENATAL MULTIVITAMIN) TABS Take 1 tablet by mouth daily. (Patient not taking: Reported on 06/29/2022)       Review of Systems  Constitutional:  Negative for chills, diaphoresis, fatigue and fever.  Eyes:  Negative for visual disturbance.  Respiratory:  Negative for shortness of breath.   Cardiovascular:  Negative for chest pain.  Gastrointestinal:  Negative for abdominal pain, constipation, diarrhea, nausea and vomiting.  Genitourinary:  Positive for pelvic pain ((cramping)) and vaginal bleeding. Negative for dysuria, flank pain, frequency, urgency and vaginal discharge.  Neurological:  Negative for dizziness, weakness, light-headedness and headaches.   Physical Exam   Blood pressure 130/71, pulse 75, temperature 98 F (36.7 C), temperature source Oral, resp. rate 20, weight 64.5 kg, last menstrual period 05/11/2022, SpO2 99 %.  Patient Vitals for the past 24 hrs:  BP Temp Temp src Pulse Resp SpO2 Weight  06/30/22 1143  130/71 -- -- 75 -- -- --  06/30/22 1120 135/78 98 F (36.7 C) Oral 70 20 99 % 64.5 kg   Physical Exam Vitals and nursing note reviewed.  Constitutional:      General: She is not in acute distress.    Appearance: Normal appearance. She is not ill-appearing, toxic-appearing or diaphoretic.  HENT:     Head: Normocephalic and atraumatic.  Pulmonary:     Effort: Pulmonary effort is normal.  Genitourinary:    Comments: Pad check shows minimal spotting present on pad, pt offered and declines pelvic exam. Neurological:     Mental Status: She is alert and oriented to person, place, and time.  Psychiatric:        Mood and Affect: Mood normal.        Behavior: Behavior  normal.        Thought Content: Thought content normal.        Judgment: Judgment normal.    No results found for this or any previous visit (from the past 24 hour(s)).  US OB Transvaginal  Result Date: 06/28/2022 CLINICAL DATA:  Spotting EXAM: TRANSVAGINAL OB ULTRASOUND TECHNIQUE: Transvaginal ultrasound was performed for complete evaluation of the gestation as well as the maternal uterus, adnexal regions, and pelvic cul-de-sac. COMPARISON:  None Available. FINDINGS: Intrauterine gestational sac: Small fluid collection noted within the endometrium which may reflect early gestational sac. Yolk sac:  Not Visualized. Embryo:  Not Visualized. Cardiac Activity: Not Visualized. Heart Rate:  bpm MSD: 2.4 mm   4 w   6 d CRL:     mm    w  d                  [place] EDC: Subchorionic hemorrhage:  None visualized. Maternal uterus/adnexae: No adnexal mass or free fluid. 2.4 cm posterior fundal fibroid. IMPRESSION: Probable early intrauterine gestational sac, but no yolk sac, fetal pole, or cardiac activity yet visualized. Recommend follow-up quantitative B-HCG levels and follow-up US in 14 days to assess viability. This recommendation follows SRU consensus guidelines: Diagnostic Criteria for Nonviable Pregnancy Early in the First Trimester. [place] Med 2013; 169:6789-38. Electronically Signed   By: Rolm Baptise M.D.   On: 06/28/2022 00:10   US OB LESS THAN 14 WEEKS WITH OB TRANSVAGINAL  Result Date: 06/26/2022 CLINICAL DATA:  Pregnant patient with spotting. Pain. Beta hCG not provided. LMP 05/11/2022 EXAM: OBSTETRIC <14 WK [place] AND TRANSVAGINAL OB US TECHNIQUE: Both transabdominal and transvaginal ultrasound examinations were performed for complete evaluation of the gestation as well as the maternal uterus, adnexal regions, and pelvic cul-de-sac. Transvaginal technique was performed to assess early pregnancy. COMPARISON:  None Available. FINDINGS: Intrauterine gestational sac: None Yolk sac:  Not Visualized. Embryo:  Not  Visualized. Subchorionic hemorrhage:  Not applicable. Maternal uterus/adnexae: The uterus is anteverted. There is a 1.9 cm intramural right fundal fibroid. No intrauterine gestational sac. Endometrial thickness of 12 mm. Both ovaries are visualized and are normal. No adnexal mass. No pelvic free fluid. IMPRESSION: No intrauterine pregnancy or findings suspicious for ectopic pregnancy. Findings are consistent with pregnancy of unknown location and may reflect early intrauterine pregnancy not yet visualized sonographically, occult ectopic pregnancy, or failed pregnancy. Recommend trending of beta HCG and sonographic follow-up in 7-10 days as clinically indicated. Electronically Signed   By: Keith Rake M.D.   On: 06/26/2022 22:59    MAU Course  Procedures  MDM -consulted with Dr. Elonda Husky who confirms per hCG performed in office on  Friday that this is a miscarriage -results discussed with patient -pt offered and declines Cytotec -questions answered to patient satisfaction -pt discharged to home in stable condition  Orders Placed This Encounter  Procedures   Discharge patient    Order Specific Question:   Discharge disposition    Answer:   01-Home or Self Care [1]    Order Specific Question:   Discharge patient date    Answer:   06/30/2022   No orders of the defined types were placed in this encounter.  Assessment and Plan   1. Miscarriage   2. Blood type, Rh positive     Allergies as of 06/30/2022   No Known Allergies      Medication List     TAKE these medications    prenatal multivitamin Tabs tablet Take 1 tablet by mouth daily.       -expected SAB s/sx discussed -warning signs reviewed -message sent to [facility] for f/u lab visit in 1 week and provider visit in 2 weeks -pt discharged to home in stable condition  Gerrie Nordmann Tammy Rojas 06/30/2022, 12:33 PM

## 2022-06-30 NOTE — MAU Note (Signed)
Tammy Rojas is a 36 y.o. at 74w1dhere in MAU reporting: lower abdominal pain and VB.  Reports VB is a little more than a menstrual cycle and is changing sanitary napkin every 2 hours. LMP: N/A Onset of complaint: today Pain score: 6 Vitals:   06/30/22 1120  BP: 135/78  Pulse: 70  Resp: 20  Temp: 98 F (36.7 C)  SpO2: 99%     FHT:N/A Lab orders placed from triage:   None

## 2022-07-09 ENCOUNTER — Other Ambulatory Visit: Payer: Self-pay

## 2022-07-09 ENCOUNTER — Ambulatory Visit: Payer: No Typology Code available for payment source

## 2022-07-09 DIAGNOSIS — O039 Complete or unspecified spontaneous abortion without complication: Secondary | ICD-10-CM

## 2022-07-10 LAB — BETA HCG QUANT (REF LAB): hCG Quant: 1431 m[IU]/mL

## 2022-07-17 ENCOUNTER — Ambulatory Visit (INDEPENDENT_AMBULATORY_CARE_PROVIDER_SITE_OTHER): Payer: Self-pay

## 2022-07-17 ENCOUNTER — Other Ambulatory Visit: Payer: Self-pay

## 2022-07-17 VITALS — BP 124/90 | HR 75 | Wt 142.0 lb

## 2022-07-17 DIAGNOSIS — O039 Complete or unspecified spontaneous abortion without complication: Secondary | ICD-10-CM

## 2022-07-17 NOTE — Progress Notes (Signed)
Patient here for SAB follow up. She stated that Friday was the last day of bleeding and denies any pain.  Patient informed me that does wish to get pregnant sometimes this year

## 2022-07-17 NOTE — Progress Notes (Signed)
   GYNECOLOGY PROGRESS NOTE  History:  36 y.o. [id number] presents to [facility] office today for follow up after SAB. She reports for 2 days after her hospital visit, she experienced a lot of bleeding with clots and cramping. Since then, bleeding and cramping have both subsided.   The following portions of the patient's history were reviewed and updated as appropriate: allergies, current medications, past family history, past medical history, past social history, past surgical history and problem list. Last pap smear performed at [facility], was normal, and is not due until next year per patient.   Health Maintenance Due  Topic Date Due   Hepatitis C Screening  Never done   PAP SMEAR-Modifier  Never done   INFLUENZA VACCINE  06/19/2022     Review of Systems:  Pertinent items are noted in HPI.   Objective:  Physical Exam Blood pressure (!) 124/90, pulse 75, weight 142 lb (64.4 kg), last menstrual period 05/11/2022, not currently breastfeeding. VS reviewed, nursing note reviewed,  Constitutional: well developed, well nourished, no distress HEENT: normocephalic CV: normal rate Pulm/chest wall: normal effort Breast Exam: deferred Abdomen: soft, nontender Neuro: alert and oriented x 3 Skin: warm, dry Psych: affect normal Pelvic exam: deferred  Assessment & Plan:  1. Miscarriage - Repeat bHCG today. Repeat weekly until back to pre-pregnancy readings - Patient desires pregnancy. Recommend avoiding conception until bHCG has returned to normal. Once normal, may try to conceive again - Continue PNV and folic acid - In person Spanish interpretor used for entirety of today's visit  - B-HCG Quant  Return in about 1 week (around 07/24/2022) for repeat bHCG- lab only visit.   Renee Harder, CNM 2:46 PM

## 2022-07-18 ENCOUNTER — Other Ambulatory Visit: Payer: Self-pay | Admitting: *Deleted

## 2022-07-18 DIAGNOSIS — O039 Complete or unspecified spontaneous abortion without complication: Secondary | ICD-10-CM

## 2022-07-18 LAB — BETA HCG QUANT (REF LAB): hCG Quant: 558 m[IU]/mL

## 2022-07-24 ENCOUNTER — Other Ambulatory Visit: Payer: Self-pay

## 2022-07-24 ENCOUNTER — Other Ambulatory Visit: Payer: No Typology Code available for payment source

## 2022-07-24 DIAGNOSIS — O039 Complete or unspecified spontaneous abortion without complication: Secondary | ICD-10-CM

## 2022-07-25 LAB — BETA HCG QUANT (REF LAB): hCG Quant: 222 m[IU]/mL

## 2022-07-31 ENCOUNTER — Telehealth: Payer: Self-pay

## 2022-07-31 DIAGNOSIS — O039 Complete or unspecified spontaneous abortion without complication: Secondary | ICD-10-CM

## 2022-07-31 NOTE — Telephone Encounter (Addendum)
-----   Message from Renee Harder, CNM sent at 07/28/2022 12:15 PM EDT ----- bHCG 222. Needs repeat bHCG on 9/12 or after.  Thanks! Danielle  -------------------------------  Results and provider recommendation given. Pt will return for repeat HCG tomorrow.

## 2022-08-01 ENCOUNTER — Other Ambulatory Visit: Payer: No Typology Code available for payment source

## 2022-08-01 ENCOUNTER — Other Ambulatory Visit: Payer: Self-pay

## 2022-08-01 DIAGNOSIS — O039 Complete or unspecified spontaneous abortion without complication: Secondary | ICD-10-CM

## 2022-08-02 LAB — BETA HCG QUANT (REF LAB): hCG Quant: 2 m[IU]/mL

## 2022-09-28 ENCOUNTER — Ambulatory Visit: Payer: No Typology Code available for payment source | Admitting: Internal Medicine

## 2022-12-27 ENCOUNTER — Inpatient Hospital Stay (HOSPITAL_COMMUNITY)
Admission: AD | Admit: 2022-12-27 | Discharge: 2022-12-27 | Disposition: A | Discharge status: Home / Self Care | Payer: No Typology Code available for payment source | Attending: Obstetrics and Gynecology | Admitting: Obstetrics and Gynecology

## 2022-12-27 ENCOUNTER — Encounter (HOSPITAL_COMMUNITY): Payer: Self-pay

## 2022-12-27 ENCOUNTER — Other Ambulatory Visit: Payer: Self-pay

## 2022-12-27 DIAGNOSIS — Z1152 Encounter for screening for COVID-19: Secondary | ICD-10-CM | POA: Insufficient documentation

## 2022-12-27 DIAGNOSIS — O26899 Other specified pregnancy related conditions, unspecified trimester: Secondary | ICD-10-CM

## 2022-12-27 DIAGNOSIS — O26891 Other specified pregnancy related conditions, first trimester: Secondary | ICD-10-CM | POA: Insufficient documentation

## 2022-12-27 DIAGNOSIS — R109 Unspecified abdominal pain: Secondary | ICD-10-CM | POA: Insufficient documentation

## 2022-12-27 DIAGNOSIS — R6883 Chills (without fever): Secondary | ICD-10-CM | POA: Insufficient documentation

## 2022-12-27 DIAGNOSIS — Z3A11 11 weeks gestation of pregnancy: Secondary | ICD-10-CM | POA: Insufficient documentation

## 2022-12-27 LAB — URINALYSIS, ROUTINE W REFLEX MICROSCOPIC
Bilirubin Urine: NEGATIVE
Glucose, UA: NEGATIVE mg/dL
Hgb urine dipstick: NEGATIVE
Ketones, ur: NEGATIVE mg/dL
Nitrite: NEGATIVE
Protein, ur: NEGATIVE mg/dL
Specific Gravity, Urine: 1.01 (ref 1.005–1.030)
pH: 7 (ref 5.0–8.0)

## 2022-12-27 LAB — RESP PANEL BY RT-PCR (RSV, FLU A&B, COVID)  RVPGX2
Influenza A by PCR: NEGATIVE
Influenza B by PCR: NEGATIVE
Resp Syncytial Virus by PCR: NEGATIVE
SARS Coronavirus 2 by RT PCR: NEGATIVE

## 2022-12-27 LAB — WET PREP, GENITAL
Clue Cells Wet Prep HPF POC: NONE SEEN
Sperm: NONE SEEN
Trich, Wet Prep: NONE SEEN
WBC, Wet Prep HPF POC: >=10 — AB (ref <10)
Yeast Wet Prep HPF POC: NONE SEEN

## 2022-12-27 LAB — POCT PREGNANCY, URINE: Preg Test, Ur: POSITIVE — AB

## 2022-12-27 MED ORDER — ACETAMINOPHEN 500 MG PO TABS
1000.0000 mg | ORAL_TABLET | Freq: Once | ORAL | Status: AC
  Administered 2022-12-27: 1000 mg via ORAL
  Filled 2022-12-27: qty 2

## 2022-12-27 NOTE — MAU Provider Note (Signed)
History     CSN: [id number]  Arrival date and time: 12/27/22 1536   Event Date/Time   First Provider Initiated Contact with Patient 12/27/22 1737      Chief Complaint  Patient presents with   Abdominal Pain   HPI  [name] is a 37 y.o. [id number] @ 2w2dhere in MAU with complaints of abdominal pain. The pain started yesterday. The pain is located all over her lower abdomen. The pain comes and goes. She reports some burning when she urinates. No bleeding. Certain of LMP.  Feels like she has chills and that she is coming down with a virus. She has no back pain, no fever.   OB History     Gravida  5   Para  3   Term  3   Preterm  0   AB  0   Living  3      SAB  0   IAB  0   Ectopic  0   Multiple  0   Live Births  1           Past Medical History:  Diagnosis Date   Medical history non-contributory    No pertinent past medical history     Past Surgical History:  Procedure Laterality Date   APPENDECTOMY  2020   CESAREAN SECTION     LAPAROSCOPIC APPENDECTOMY N/A 06/07/2018   Procedure: APPENDECTOMY LAPAROSCOPIC;  Surgeon: WJudeth Horn MD;  Location: [facility]  Service: General;  Laterality: N/A;    Family History  Problem Relation Age of Onset   Early death Mother    Diabetes Father     Social History   Tobacco Use   Smoking status: Never   Smokeless tobacco: Never  Vaping Use   Vaping Use: Never used  Substance Use Topics   Alcohol use: No   Drug use: No    Allergies: No Known Allergies  Medications Prior to Admission  Medication Sig Dispense Refill Last Dose   Prenatal Vit-Fe Fumarate-FA (PRENATAL MULTIVITAMIN) TABS Take 1 tablet by mouth daily.   12/27/2022   Results for orders placed or performed during the hospital encounter of 12/27/22 (from the past 72 hour(s))  Urinalysis, Routine w reflex microscopic -Urine, Clean Catch     Status: Abnormal   Collection Time: 12/27/22  4:17 PM  Result Value Ref Range   Color, Urine  YELLOW YELLOW   APPearance HAZY (A) CLEAR   Specific Gravity, Urine 1.010 1.005 - 1.030   pH 7.0 5.0 - 8.0   Glucose, UA NEGATIVE NEGATIVE mg/dL   Hgb urine dipstick NEGATIVE NEGATIVE   Bilirubin Urine NEGATIVE NEGATIVE   Ketones, ur NEGATIVE NEGATIVE mg/dL   Protein, ur NEGATIVE NEGATIVE mg/dL   Nitrite NEGATIVE NEGATIVE   Leukocytes,Ua MODERATE (A) NEGATIVE   RBC / HPF 0-5 0 - 5 RBC/hpf   WBC, UA 6-10 0 - 5 WBC/hpf   Bacteria, UA RARE (A) NONE SEEN   Squamous Epithelial / HPF 6-10 0 - 5 /HPF    Comment: Performed at [facility] [address] [place] [address] Culture, [place]     Status: Abnormal   Collection Time: 12/27/22  4:17 PM   Specimen: OB Clean Catch; Urine  Result Value Ref Range   Specimen Description OB CLEAN CATCH    Special Requests NONE    Culture (A)     MULTIPLE SPECIES PRESENT, SUGGEST RECOLLECTION NO GROUP B STREP (S.AGALACTIAE) ISOLATED Performed at [facility], [place] [address], [place], [place] [address]    Report Status 12/29/2022 FINAL   Pregnancy, urine POC     Status: Abnormal   Collection Time: 12/27/22  4:24 PM  Result Value Ref Range   Preg Test, Ur POSITIVE (A) NEGATIVE    Comment:        THE SENSITIVITY OF THIS METHODOLOGY IS >24 mIU/mL   GC/Chlamydia probe amp ([place])not at [facility]     Status: None   Collection Time: 12/27/22  5:27 PM  Result Value Ref Range   Neisseria Gonorrhea Negative    Chlamydia Negative    Comment Normal Reference Ranger Chlamydia - Negative    Comment      Normal Reference Range Neisseria Gonorrhea - Negative  Wet prep, genital     Status: Abnormal   Collection Time: 12/27/22  5:33 PM   Specimen: Vaginal  Result Value Ref Range   Yeast Wet Prep HPF POC NONE SEEN NONE SEEN   Trich, Wet Prep NONE SEEN NONE SEEN   Clue Cells Wet Prep HPF POC NONE SEEN NONE SEEN   WBC, Wet Prep HPF POC >=10 (A) <10   Sperm NONE SEEN     Comment: Performed at [facility], [place] [address], [place], [place] [address]  Resp panel by RT-PCR (RSV, Flu A&B, Covid) Anterior Nasal Swab     Status: None   Collection Time: 12/27/22  5:37 PM   Specimen: Anterior Nasal Swab  Result Value Ref Range   SARS Coronavirus 2 by RT PCR NEGATIVE NEGATIVE   Influenza A by PCR NEGATIVE NEGATIVE   Influenza B by PCR NEGATIVE NEGATIVE    Comment: (NOTE) The Xpert Xpress SARS-CoV-2/FLU/RSV plus assay is intended as an aid in the diagnosis of influenza from Nasopharyngeal swab specimens and should not be used as a sole basis for treatment. Nasal washings and aspirates are unacceptable for Xpert Xpress SARS-CoV-2/FLU/RSV testing.  Fact Sheet for Patients: EntrepreneurPulse.com.au  Fact Sheet for Healthcare Providers: IncredibleEmployment.be  This test is not yet approved or cleared by the [place] FDA and has been authorized for detection and/or diagnosis of SARS-CoV-2 by FDA under an Emergency Use Authorization (EUA). This EUA will remain in effect (meaning this test can be used) for the duration of the COVID-19 declaration under Section 564(b)(1) of the Act, 21 U.S.C. section 360bbb-3(b)(1), unless the authorization is terminated or revoked.     Resp Syncytial Virus by PCR NEGATIVE NEGATIVE    Comment: (NOTE) Fact Sheet for Patients: EntrepreneurPulse.com.au  Fact Sheet for Healthcare Providers: IncredibleEmployment.be  This test is not yet approved or cleared by the [place] FDA and has been authorized for detection and/or diagnosis of SARS-CoV-2 by FDA under an Emergency Use Authorization (EUA). This EUA will remain in effect (meaning this test can be used) for the duration of the COVID-19 declaration under Section 564(b)(1) of the Act, 21 U.S.C. section 360bbb-3(b)(1), unless the authorization is terminated or revoked.  Performed at [facility], [place] [address], [place], [place] [address]       Review of Systems  Constitutional:  Negative for fever.  Gastrointestinal:  Positive for abdominal pain.  Genitourinary:  Positive for dysuria. Negative for frequency, urgency, vaginal bleeding and vaginal discharge.   Physical Exam   Blood pressure 130/83, pulse 97, temperature 99.3 F (37.4 C), temperature source Oral, resp. rate 16, weight 63.7 kg, last menstrual period 10/09/2022, SpO2 99 %.  Physical Exam Constitutional:  Appearance: She is well-developed.  HENT:     Head: Normocephalic.  Abdominal:     Tenderness: There is generalized abdominal tenderness. There is no guarding.  Skin:    General: Skin is warm.  Neurological:     Mental Status: She is alert and oriented to person, place, and time.    MAU Course  Procedures Pt informed that the ultrasound is considered a limited OB ultrasound and is not intended to be a complete ultrasound exam.  Patient also informed that the ultrasound is not being completed with the intent of assessing for fetal or placental anomalies or any pelvic abnormalities.  Explained that the purpose of today's ultrasound is to assess for  viability.  Patient acknowledges the purpose of the exam and the limitations of the study.        MDM  Reviewed limited MAU [place] with Dr. Damita Dunnings.  Tylenol 1 gram given UA and Urine culture collected Flu/Covid resp panel collected and negative.    Assessment and Plan   A:  1. Abdominal pain during pregnancy, antepartum   2. [redacted] weeks gestation of pregnancy   3. Chills     P:  Dc home Fluids, and rest Ok to use tylenol for symptom management Return to MAU if symptoms worsen PV daily Start OB care.  Lezlie Lye, NP 12/29/2022 10:59 AM

## 2022-12-27 NOTE — MAU Note (Signed)
Tammy Rojas is a 37 y.o. here in MAU reporting: yesterday started having lower abdominal pain. Has also been feeling chills. When she goes to the bathroom she is having some burning when she pees. No bleeding.   LMP: 10/09/22  Onset of complaint: ongoing  Pain score: 5/10  Vitals:   12/27/22 1609  BP: 134/85  Pulse: 99  Resp: 16  Temp: 99.3 F (37.4 C)  SpO2: 99%     FHT:NA  Lab orders placed from triage: upt

## 2022-12-27 NOTE — Progress Notes (Signed)
Fetus visualized with cardiac activity and audibly heard with bedside US performed by Noni Saupe, NP

## 2022-12-28 LAB — GC/CHLAMYDIA PROBE AMP (~~LOC~~) NOT AT ARMC
Chlamydia: NEGATIVE
Comment: NEGATIVE
Comment: NORMAL
Neisseria Gonorrhea: NEGATIVE

## 2022-12-29 LAB — CULTURE, OB URINE

## 2023-01-07 ENCOUNTER — Other Ambulatory Visit: Payer: Self-pay

## 2023-01-07 ENCOUNTER — Inpatient Hospital Stay (HOSPITAL_COMMUNITY): Payer: Medicaid Other

## 2023-01-07 ENCOUNTER — Inpatient Hospital Stay (HOSPITAL_COMMUNITY)
Admission: AD | Admit: 2023-01-07 | Discharge: 2023-01-07 | Disposition: A | Discharge status: Home / Self Care | Payer: Medicaid Other | Attending: Family Medicine | Admitting: Family Medicine

## 2023-01-07 ENCOUNTER — Encounter (HOSPITAL_COMMUNITY): Payer: Self-pay | Admitting: Family Medicine

## 2023-01-07 ENCOUNTER — Encounter (HOSPITAL_COMMUNITY)
Admission: AD | Disposition: A | Discharge status: Home / Self Care | Payer: Self-pay | Source: Home / Self Care | Attending: Family Medicine

## 2023-01-07 ENCOUNTER — Inpatient Hospital Stay (HOSPITAL_COMMUNITY): Payer: Medicaid Other | Admitting: Certified Registered"

## 2023-01-07 ENCOUNTER — Inpatient Hospital Stay (EMERGENCY_DEPARTMENT_HOSPITAL): Payer: Medicaid Other | Admitting: Certified Registered"

## 2023-01-07 DIAGNOSIS — Z3A12 12 weeks gestation of pregnancy: Secondary | ICD-10-CM

## 2023-01-07 DIAGNOSIS — O3411 Maternal care for benign tumor of corpus uteri, first trimester: Secondary | ICD-10-CM | POA: Insufficient documentation

## 2023-01-07 DIAGNOSIS — D251 Intramural leiomyoma of uterus: Secondary | ICD-10-CM | POA: Insufficient documentation

## 2023-01-07 DIAGNOSIS — Z3A09 9 weeks gestation of pregnancy: Secondary | ICD-10-CM

## 2023-01-07 DIAGNOSIS — O00101 Right tubal pregnancy without intrauterine pregnancy: Secondary | ICD-10-CM

## 2023-01-07 DIAGNOSIS — O09521 Supervision of elderly multigravida, first trimester: Secondary | ICD-10-CM | POA: Insufficient documentation

## 2023-01-07 HISTORY — PX: LAPAROSCOPIC UNILATERAL SALPINGECTOMY: SHX5934

## 2023-01-07 LAB — URINALYSIS, ROUTINE W REFLEX MICROSCOPIC
Bilirubin Urine: NEGATIVE
Glucose, UA: NEGATIVE mg/dL
Ketones, ur: NEGATIVE mg/dL
Leukocytes,Ua: NEGATIVE
Nitrite: NEGATIVE
Protein, ur: NEGATIVE mg/dL
Specific Gravity, Urine: <1.005 — ABNORMAL LOW (ref 1.005–1.030)
pH: 6.5 (ref 5.0–8.0)

## 2023-01-07 LAB — COMPREHENSIVE METABOLIC PANEL
ALT: 41 U/L (ref 0–44)
AST: 32 U/L (ref 15–41)
Albumin: 3.2 g/dL — ABNORMAL LOW (ref 3.5–5.0)
Alkaline Phosphatase: 81 U/L (ref 38–126)
Anion gap: 12 (ref 5–15)
BUN: <5 mg/dL — ABNORMAL LOW (ref 6–20)
CO2: 21 mmol/L — ABNORMAL LOW (ref 22–32)
Calcium: 9.1 mg/dL (ref 8.9–10.3)
Chloride: 104 mmol/L (ref 98–111)
Creatinine, Ser: 0.51 mg/dL (ref 0.44–1.00)
GFR, Estimated: >60 mL/min (ref >60)
Glucose, Bld: 85 mg/dL (ref 70–99)
Potassium: 3.5 mmol/L (ref 3.5–5.1)
Sodium: 137 mmol/L (ref 135–145)
Total Bilirubin: 0.4 mg/dL (ref 0.3–1.2)
Total Protein: 7 g/dL (ref 6.5–8.1)

## 2023-01-07 LAB — TYPE AND SCREEN
ABO/RH(D): O POS
Antibody Screen: NEGATIVE

## 2023-01-07 LAB — URINALYSIS, MICROSCOPIC (REFLEX): Bacteria, UA: NONE SEEN

## 2023-01-07 LAB — CBC
HCT: 35.1 % — ABNORMAL LOW (ref 36.0–46.0)
Hemoglobin: 12.3 g/dL (ref 12.0–15.0)
MCH: 31.8 pg (ref 26.0–34.0)
MCHC: 35 g/dL (ref 30.0–36.0)
MCV: 90.7 fL (ref 80.0–100.0)
Platelets: 562 10*3/uL — ABNORMAL HIGH (ref 150–400)
RBC: 3.87 MIL/uL (ref 3.87–5.11)
RDW: 12.1 % (ref 11.5–15.5)
WBC: 10.6 10*3/uL — ABNORMAL HIGH (ref 4.0–10.5)
nRBC: 0 % (ref 0.0–0.2)

## 2023-01-07 LAB — HCG, QUANTITATIVE, PREGNANCY: hCG, Beta Chain, Quant, S: 26172 m[IU]/mL — ABNORMAL HIGH (ref <5)

## 2023-01-07 SURGERY — SALPINGECTOMY, UNILATERAL, LAPAROSCOPIC
Anesthesia: General | Site: Abdomen

## 2023-01-07 MED ORDER — OXYCODONE-ACETAMINOPHEN 5-325 MG PO TABS: 1.0000 | ORAL_TABLET | Freq: Four times a day (QID) | ORAL | 0 refills | Status: DC | PRN

## 2023-01-07 MED ORDER — FENTANYL CITRATE (PF) 250 MCG/5ML IJ SOLN
INTRAMUSCULAR | Status: AC
  Filled 2023-01-07: qty 5

## 2023-01-07 MED ORDER — BUPIVACAINE HCL (PF) 0.25 % IJ SOLN
INTRAMUSCULAR | Status: AC
  Filled 2023-01-07: qty 30

## 2023-01-07 MED ORDER — PROPOFOL 10 MG/ML IV BOLUS
INTRAVENOUS | Status: AC
  Filled 2023-01-07: qty 20

## 2023-01-07 MED ORDER — ROCURONIUM BROMIDE 10 MG/ML (PF) SYRINGE
PREFILLED_SYRINGE | INTRAVENOUS | Status: DC | PRN
  Administered 2023-01-07: 25 mg via INTRAVENOUS

## 2023-01-07 MED ORDER — KETOROLAC TROMETHAMINE 30 MG/ML IJ SOLN
INTRAMUSCULAR | Status: AC
  Filled 2023-01-07: qty 1

## 2023-01-07 MED ORDER — IBUPROFEN 600 MG PO TABS: 600.0000 mg | ORAL_TABLET | Freq: Four times a day (QID) | ORAL | 3 refills | Status: DC | PRN

## 2023-01-07 MED ORDER — SUCCINYLCHOLINE CHLORIDE 200 MG/10ML IV SOSY
PREFILLED_SYRINGE | INTRAVENOUS | Status: DC | PRN
  Administered 2023-01-07: 160 mg via INTRAVENOUS

## 2023-01-07 MED ORDER — ACETAMINOPHEN 10 MG/ML IV SOLN: 1000.0000 mg | Freq: Once | INTRAVENOUS | Status: DC | PRN

## 2023-01-07 MED ORDER — FENTANYL CITRATE (PF) 250 MCG/5ML IJ SOLN
INTRAMUSCULAR | Status: DC | PRN
  Administered 2023-01-07: 150 ug via INTRAVENOUS

## 2023-01-07 MED ORDER — CHLORHEXIDINE GLUCONATE 0.12 % MT SOLN
15.0000 mL | Freq: Once | OROMUCOSAL | Status: AC
  Administered 2023-01-07: 15 mL via OROMUCOSAL

## 2023-01-07 MED ORDER — LACTATED RINGERS IV SOLN: INTRAVENOUS | Status: DC

## 2023-01-07 MED ORDER — MEPERIDINE HCL 25 MG/ML IJ SOLN: 6.2500 mg | INTRAMUSCULAR | Status: DC | PRN

## 2023-01-07 MED ORDER — EPHEDRINE 5 MG/ML INJ
INTRAVENOUS | Status: AC
  Filled 2023-01-07: qty 5

## 2023-01-07 MED ORDER — HYDROMORPHONE HCL 1 MG/ML IJ SOLN: 0.2500 mg | INTRAMUSCULAR | Status: DC | PRN

## 2023-01-07 MED ORDER — ONDANSETRON HCL 4 MG/2ML IJ SOLN
INTRAMUSCULAR | Status: AC
  Filled 2023-01-07: qty 2

## 2023-01-07 MED ORDER — MIDAZOLAM HCL 2 MG/2ML IJ SOLN
INTRAMUSCULAR | Status: DC | PRN
  Administered 2023-01-07: 2 mg via INTRAVENOUS

## 2023-01-07 MED ORDER — KETOROLAC TROMETHAMINE 30 MG/ML IJ SOLN
INTRAMUSCULAR | Status: DC | PRN
  Administered 2023-01-07: 30 mg via INTRAVENOUS

## 2023-01-07 MED ORDER — DEXAMETHASONE SODIUM PHOSPHATE 10 MG/ML IJ SOLN
INTRAMUSCULAR | Status: AC
  Filled 2023-01-07: qty 1

## 2023-01-07 MED ORDER — BUPIVACAINE HCL (PF) 0.25 % IJ SOLN
INTRAMUSCULAR | Status: DC | PRN
  Administered 2023-01-07: 30 mL

## 2023-01-07 MED ORDER — MIDAZOLAM HCL 2 MG/2ML IJ SOLN
INTRAMUSCULAR | Status: AC
  Filled 2023-01-07: qty 2

## 2023-01-07 MED ORDER — HEMOSTATIC AGENTS (NO CHARGE) OPTIME
TOPICAL | Status: DC | PRN
  Administered 2023-01-07: 1 via TOPICAL

## 2023-01-07 MED ORDER — DEXAMETHASONE SODIUM PHOSPHATE 10 MG/ML IJ SOLN
INTRAMUSCULAR | Status: DC | PRN
  Administered 2023-01-07: 10 mg via INTRAVENOUS

## 2023-01-07 MED ORDER — SODIUM CHLORIDE 0.9 % IR SOLN
Status: DC | PRN
  Administered 2023-01-07: 1000 mL

## 2023-01-07 MED ORDER — ORAL CARE MOUTH RINSE: 15.0000 mL | Freq: Once | OROMUCOSAL | Status: AC

## 2023-01-07 MED ORDER — PROMETHAZINE HCL 25 MG/ML IJ SOLN: 6.2500 mg | INTRAMUSCULAR | Status: DC | PRN

## 2023-01-07 MED ORDER — ONDANSETRON HCL 4 MG/2ML IJ SOLN
INTRAMUSCULAR | Status: DC | PRN
  Administered 2023-01-07: 4 mg via INTRAVENOUS

## 2023-01-07 MED ORDER — PROPOFOL 10 MG/ML IV BOLUS
INTRAVENOUS | Status: DC | PRN
  Administered 2023-01-07: 50 mg via INTRAVENOUS
  Administered 2023-01-07: 200 mg via INTRAVENOUS

## 2023-01-07 MED ORDER — POVIDONE-IODINE 10 % EX SWAB
2.0000 | Freq: Once | CUTANEOUS | Status: AC
  Administered 2023-01-07: 2 via TOPICAL

## 2023-01-07 MED ORDER — ALBUMIN HUMAN 5 % IV SOLN: INTRAVENOUS | Status: DC | PRN

## 2023-01-07 MED ORDER — PHENYLEPHRINE 80 MCG/ML (10ML) SYRINGE FOR IV PUSH (FOR BLOOD PRESSURE SUPPORT)
PREFILLED_SYRINGE | INTRAVENOUS | Status: DC | PRN
  Administered 2023-01-07 (×3): 80 ug via INTRAVENOUS
  Administered 2023-01-07: 160 ug via INTRAVENOUS

## 2023-01-07 MED ORDER — EPHEDRINE SULFATE-NACL 50-0.9 MG/10ML-% IV SOSY
PREFILLED_SYRINGE | INTRAVENOUS | Status: DC | PRN
  Administered 2023-01-07: 20 mg via INTRAVENOUS
  Administered 2023-01-07: 10 mg via INTRAVENOUS

## 2023-01-07 MED ORDER — LIDOCAINE 2% (20 MG/ML) 5 ML SYRINGE
INTRAMUSCULAR | Status: DC | PRN
  Administered 2023-01-07: 80 mg via INTRAVENOUS

## 2023-01-07 SURGICAL SUPPLY — 37 items
ADH SKN CLS APL DERMABOND .7 (GAUZE/BANDAGES/DRESSINGS) ×1
APL SRG 38 LTWT LNG FL B (MISCELLANEOUS) ×1
APPLICATOR ARISTA FLEXITIP XL (MISCELLANEOUS) IMPLANT
BAG COUNTER SPONGE SURGICOUNT (BAG) ×1 IMPLANT
BAG SPNG CNTER NS LX DISP (BAG) ×1
DERMABOND ADVANCED .7 DNX12 (GAUZE/BANDAGES/DRESSINGS) ×1 IMPLANT
DRSG OPSITE POSTOP 3X4 (GAUZE/BANDAGES/DRESSINGS) IMPLANT
DURAPREP 26ML APPLICATOR (WOUND CARE) ×1 IMPLANT
ELECT REM PT RETURN 9FT ADLT (ELECTROSURGICAL) ×1
ELECTRODE REM PT RTRN 9FT ADLT (ELECTROSURGICAL) IMPLANT
GLOVE BIOGEL PI IND STRL 6.5 (GLOVE) ×2 IMPLANT
GLOVE BIOGEL PI IND STRL 7.0 (GLOVE) ×2 IMPLANT
GLOVE SURG SS PI 6.5 STRL IVOR (GLOVE) ×1 IMPLANT
GOWN STRL REUS W/ TWL LRG LVL3 (GOWN DISPOSABLE) ×2 IMPLANT
GOWN STRL REUS W/TWL LRG LVL3 (GOWN DISPOSABLE) ×2
HEMOSTAT ARISTA ABSORB 3G PWDR (HEMOSTASIS) IMPLANT
IRRIG SUCT STRYKERFLOW 2 WTIP (MISCELLANEOUS) ×1
IRRIGATION SUCT STRKRFLW 2 WTP (MISCELLANEOUS) IMPLANT
KIT TURNOVER KIT B (KITS) ×1 IMPLANT
NS IRRIG 1000ML POUR BTL (IV SOLUTION) ×1 IMPLANT
PACK LAPAROSCOPY BASIN (CUSTOM PROCEDURE TRAY) ×1 IMPLANT
PACK TRENDGUARD 450 HYBRID PRO (MISCELLANEOUS) IMPLANT
PROTECTOR NERVE ULNAR (MISCELLANEOUS) ×2 IMPLANT
SET TUBE SMOKE EVAC HIGH FLOW (TUBING) ×1 IMPLANT
SHEARS HARMONIC ACE PLUS 36CM (ENDOMECHANICALS) IMPLANT
SLEEVE Z-THREAD 5X100MM (TROCAR) ×1 IMPLANT
SUT MNCRL AB 4-0 PS2 18 (SUTURE) ×1 IMPLANT
SUT VICRYL 0 UR6 27IN ABS (SUTURE) ×2 IMPLANT
SYS BAG RETRIEVAL 10MM (BASKET)
SYSTEM BAG RETRIEVAL 10MM (BASKET) IMPLANT
TOWEL GREEN STERILE FF (TOWEL DISPOSABLE) ×2 IMPLANT
TRAY FOLEY W/BAG SLVR 14FR (SET/KITS/TRAYS/PACK) ×1 IMPLANT
TRENDGUARD 450 HYBRID PRO PACK (MISCELLANEOUS)
TROCAR BALLN 12MMX100 BLUNT (TROCAR) ×1 IMPLANT
TROCAR XCEL NON-BLD 5MMX100MML (ENDOMECHANICALS) ×1 IMPLANT
TROCAR Z-THREAD OPTICAL 5X100M (TROCAR) IMPLANT
WARMER LAPAROSCOPE (MISCELLANEOUS) ×1 IMPLANT

## 2023-01-07 NOTE — Transfer of Care (Signed)
Immediate Anesthesia Transfer of Care Note  Patient: Tammy Rojas  Procedure(s) Performed: LAPAROSCOPIC RIGHT SALPINGECTOMY FOR ECTOPIC PREGNANCY (Abdomen)  Patient Location: PACU  Anesthesia Type:General  Level of Consciousness: awake and alert   Airway & Oxygen Therapy: Patient Spontanous Breathing and Patient connected to nasal cannula oxygen  Post-op Assessment: Report given to RN and Post -op Vital signs reviewed and stable  Post vital signs: Reviewed and stable  Last Vitals:  Vitals Value Taken Time  BP 104/60 01/07/23 1521  Temp    Pulse 93 01/07/23 1522  Resp 18 01/07/23 1522  SpO2 97 % 01/07/23 1522  Vitals shown include unvalidated device data.  Last Pain:  Vitals:   01/07/23 1315  TempSrc:   PainSc: 0-No pain         Complications: No notable events documented.

## 2023-01-07 NOTE — Op Note (Signed)
Tammy Rojas PROCEDURE DATE: 01/07/2023  PREOPERATIVE DIAGNOSIS: Ruptured ectopic pregnancy POSTOPERATIVE DIAGNOSIS: Ruptured right fallopian tube ectopic pregnancy PROCEDURE: Laparoscopic right salpingectomy and removal of ectopic pregnancy SURGEON:  Dr. Mora Bellman ASSISTANT: none ANESTHESIOLOGIST: Suzette Battiest, MD Anesthesiologist: Suzette Battiest, MD; Lyn Hollingshead, MD CRNA: Valda Favia, CRNA; Timoteo Expose, CRNA  INDICATIONS: 37 y.o. [id number] at 82w6dby LMP here for with ruptured ectopic pregnancy. On exam, she had stable vital signs  Lab Results  Component Value Date   HGB 12.3 01/07/2023   , blood type O POS . Patient was counseled regarding need for laparoscopic salpingectomy. Risks of surgery including bleeding which may require transfusion or reoperation, infection, injury to bowel or other surrounding organs, need for additional procedures including laparotomy and other postoperative/anesthesia complications were explained to patient.  Written informed consent was obtained.  FINDINGS:  Moderate amount of hemoperitoneum estimated to be about 200 of blood and clots.  Dilated right fallopian tube containing ectopic gestation. Small normal appearing uterus, normal left fallopian tube, right ovary and left ovary.  ANESTHESIA: General INTRAVENOUS FLUIDS: .1000 ml LR and 250 ml albumin ESTIMATED BLOOD LOSS:  300 mL  URINE OUTPUT: 100 ml SPECIMENS: right fallopian tube containing ectopic gestation COMPLICATIONS: None immediate  PROCEDURE IN DETAIL:  The patient was taken to the operating room where general anesthesia was administered and was found to be adequate.  She was placed in the dorsal lithotomy position, and was prepped and draped in a sterile manner.  A Foley catheter was inserted into her bladder and attached to Tammy Rojas drainage and a uterine manipulator was then advanced into the uterus .  After an adequate timeout was performed, attention  was then turned to the patient's abdomen where a 10-mm skin incision was made on the umbilical fold.  The fascia was identified, tented up and incised with Mayo scissors. The fascia was tagged with 0- Vicryl. The peritoneum was identified tented up and entered sharply with Metzenbaum scissors.  10-mm trocar and sleeve were then advanced without difficulty into the abdomen where intraabdominal placement was confirmed by the laparoscope. Pneumoperitoneum was achieved with insufflation of CO2 gas. A survey of the patient's pelvis and abdomen revealed the findings as above. Two left lower quadrant 5-mm ports were placed under direct visualization.  The 5-mm Nezhat suction irrigator was then used to suction the hemoperitoneum and irrigate the pelvis.  Attention was then turned to the right fallopian tube which was grasped and ligated from the underlying mesosalpinx and uterine attachment using the Harmonic scalpel instrument.  Good hemostasis was noted.  The specimen was placed in an EndoCatch bag and removed from the abdomen intact. Arista was applied over the surgical site to ensure hemostasis. The abdomen was desufflated, and all instruments were removed.  The fascial incision of the 10-mm site was reapproximated with 0 Vicryl figure-of-eight stiches; and all skin incisions were closed with a 3-0 Vicryl subcuticular stitch. The patient tolerated the procedures well.  All instruments, needles, and sponge counts were correct x 2. The patient was taken to the recovery room in stable condition.    The patient will be discharged to home as per PACU criteria.  Routine postoperative instructions given.  She will follow up in the office in 2 weeks for postoperative evaluation .

## 2023-01-07 NOTE — H&P (Signed)
Tammy Rojas is an 37 y.o. female [phone] sent from health department with concerns for ectopic pregnancy. Patient was being evaluated for new ob appointment where an office ultrasound was performed did not reveal an intrauterine pregnancy. Patient denies any pelvic pain or vaginal bleeding. She was seen on 12/27/22 with a viable pregnancy. Patient is without any concerns   Menstrual History: Patient's last menstrual period was 10/09/2022.    Past Medical History:  Diagnosis Date   Medical history non-contributory    No pertinent past medical history     Past Surgical History:  Procedure Laterality Date   APPENDECTOMY  2020   CESAREAN SECTION     LAPAROSCOPIC APPENDECTOMY N/A 06/07/2018   Procedure: APPENDECTOMY LAPAROSCOPIC;  Surgeon: Judeth Horn, MD;  Location: [place] [facility];  Service: General;  Laterality: N/A;    Family History  Problem Relation Age of Onset   Early death Mother    Diabetes Father     Social History:  reports that she has never smoked. She has never used smokeless tobacco. She reports that she does not drink alcohol and does not use drugs.  Allergies: No Known Allergies  Medications Prior to Admission  Medication Sig Dispense Refill Last Dose   Prenatal Vit-Fe Fumarate-FA (PRENATAL MULTIVITAMIN) TABS Take 1 tablet by mouth daily.   01/06/2023    Review of Systems  Blood pressure 125/79, pulse 72, temperature 97.9 F (36.6 C), temperature source Oral, resp. rate 17, height 5' (1.524 m), weight 63.5 kg, last menstrual period 10/09/2022, SpO2 100 %. Physical Exam GENERAL: Well-developed, well-nourished female in no acute distress.  LUNGS: Clear to auscultation bilaterally.  HEART: Regular rate and rhythm. ABDOMEN: Soft, nontender, nondistended. No organomegaly. PELVIC: Deferred to OR EXTREMITIES: No cyanosis, clubbing, or edema, 2+ distal pulses.  Results for orders placed or performed during the hospital encounter of 01/07/23 (from the past 24 hour(s))   Urinalysis, Routine w reflex microscopic -Urine, Clean Catch     Status: Abnormal   Collection Time: 01/07/23 11:05 AM  Result Value Ref Range   Color, Urine YELLOW YELLOW   APPearance CLEAR CLEAR   Specific Gravity, Urine <1.005 (L) 1.005 - 1.030   pH 6.5 5.0 - 8.0   Glucose, UA NEGATIVE NEGATIVE mg/dL   Hgb urine dipstick TRACE (A) NEGATIVE   Bilirubin Urine NEGATIVE NEGATIVE   Ketones, ur NEGATIVE NEGATIVE mg/dL   Protein, ur NEGATIVE NEGATIVE mg/dL   Nitrite NEGATIVE NEGATIVE   Leukocytes,Ua NEGATIVE NEGATIVE  Urinalysis, Microscopic (reflex)     Status: None   Collection Time: 01/07/23 11:05 AM  Result Value Ref Range   RBC / HPF 0-5 0 - 5 RBC/hpf   WBC, UA 0-5 0 - 5 WBC/hpf   Bacteria, UA NONE SEEN NONE SEEN   Squamous Epithelial / HPF 0-5 0 - 5 /HPF  CBC     Status: Abnormal   Collection Time: 01/07/23 11:26 AM  Result Value Ref Range   WBC 10.6 (H) 4.0 - 10.5 K/uL   RBC 3.87 3.87 - 5.11 MIL/uL   Hemoglobin 12.3 12.0 - 15.0 g/dL   HCT 35.1 (L) 36.0 - 46.0 %   MCV 90.7 80.0 - 100.0 fL   MCH 31.8 26.0 - 34.0 pg   MCHC 35.0 30.0 - 36.0 g/dL   RDW 12.1 11.5 - 15.5 %   Platelets 562 (H) 150 - 400 K/uL   nRBC 0.0 0.0 - 0.2 %    US OB Comp Less 14 Wks  Result Date: 01/07/2023  CLINICAL DATA:  37 year old pregnant female presents with cramping and spotting. EDC by LMP: 07/16/2023, projecting to an expected gestational age of [redacted] weeks 6 days. Quantitative beta HCG pending. EXAM: OBSTETRIC <14 WK ULTRASOUND TECHNIQUE: Transabdominal ultrasound was performed for evaluation of the gestation as well as the maternal uterus and adnexal regions. COMPARISON:  No prior scans from this gestation. FINDINGS: Intrauterine gestational sac: None Maternal uterus/adnexae: Right upper uterine body intramural 2.9 x 2.0 x 3.0 cm fibroid. Bilayer endometrial thickness approximately 11 mm on limited transabdominal views, with no evidence of endometrial cavity fluid or focal endometrial mass. Right  adnexal 7.9 x 4.4 x 4.5 cm mass with internal gestational sac with fetal pole with crown-rump length 25.9 mm, projecting to a gestational age of [redacted] weeks 3 days. No fetal cardiac activity on cine, M-mode or Doppler. A separate right ovary not visualized. Left ovary not visualized. No left adnexal masses. No abnormal free fluid in the pelvis. IMPRESSION: 1. Nonviable right tubal ectopic gestation with fetal pole with 25.9 mm crown-rump length, correlating with gestational age [redacted] weeks 3 days. No fetal cardiac activity. No abnormal free fluid in the pelvis. 2. No intrauterine gestation. 3. Right upper uterine body intramural 3.0 cm fibroid. Critical Value/emergent results were called by telephone at the time of interpretation on 01/07/2023 at 12:32 pm to provider Loma Boston , who verbally acknowledged these results. Electronically Signed   By: Ilona Sorrel M.D.   On: 01/07/2023 12:34    Assessment/Plan: 37 yo with 13w3dectopic pregnancy - Discussed surgical management with laparoscopic salpingectomy. Risks, benefits and alternatives were explained including but not limited to risks of bleeding, infection and damage to adjacent organs. Patient verbalized understanding and all questions were answered - Spanish interpreter present during counseling - Consent signed  [name] 01/07/2023, 12:38 PM

## 2023-01-07 NOTE — Anesthesia Postprocedure Evaluation (Signed)
Anesthesia Post Note  Patient: Tammy Rojas  Procedure(s) Performed: LAPAROSCOPIC RIGHT SALPINGECTOMY FOR ECTOPIC PREGNANCY (Abdomen)     Patient location during evaluation: PACU Anesthesia Type: General Level of consciousness: awake Pain management: pain level controlled Vital Signs Assessment: post-procedure vital signs reviewed and stable Respiratory status: spontaneous breathing, nonlabored ventilation and respiratory function stable Cardiovascular status: blood pressure returned to baseline and stable Postop Assessment: no apparent nausea or vomiting Anesthetic complications: no   No notable events documented.  Last Vitals:  Vitals:   01/07/23 1545 01/07/23 1600  BP: 107/66 110/71  Pulse: 92 89  Resp: 18 18  Temp:  36.8 C  SpO2: 97% 97%    Last Pain:  Vitals:   01/07/23 1545  TempSrc:   PainSc: 0-No pain                 Nilda Simmer

## 2023-01-07 NOTE — MAU Provider Note (Signed)
History     CSN: [id number]  Arrival date and time: 01/07/23 1050    No chief complaint on file.  HPI This is a 37 year old G5 P3-0-1-3 at 12 weeks and 6 days based on LMP.  She was seen on 2/8 for complaints of abdominal pain.  She had a bedside ultrasound, which was interpreted as a live IUP with fetal cardiac activity of 158. She was seen today at the [facility] for a prenatal exam and the provider was unable to find cardiac activity on Dopplers.  Bedside ultrasound was done.  There is a concern that the pregnancy is ectopic with no fetal cardiac activity.  Patient is not having any pain or bleeding.  OB History     Gravida  5   Para  3   Term  3   Preterm  0   AB  0   Living  3      SAB  0   IAB  0   Ectopic  0   Multiple  0   Live Births  1           Past Medical History:  Diagnosis Date   Medical history non-contributory    No pertinent past medical history     Past Surgical History:  Procedure Laterality Date   APPENDECTOMY  2020   CESAREAN SECTION     LAPAROSCOPIC APPENDECTOMY N/A 06/07/2018   Procedure: APPENDECTOMY LAPAROSCOPIC;  Surgeon: Judeth Horn, MD;  Location: [place];  Service: General;  Laterality: N/A;    Family History  Problem Relation Age of Onset   Early death Mother    Diabetes Father     Social History   Tobacco Use   Smoking status: Never   Smokeless tobacco: Never  Vaping Use   Vaping Use: Never used  Substance Use Topics   Alcohol use: No   Drug use: No    Allergies: No Known Allergies  Medications Prior to Admission  Medication Sig Dispense Refill Last Dose   Prenatal Vit-Fe Fumarate-FA (PRENATAL MULTIVITAMIN) TABS Take 1 tablet by mouth daily.   01/06/2023    Review of Systems Physical Exam   Blood pressure 125/79, pulse 72, temperature 97.9 F (36.6 C), temperature source Oral, resp. rate 17, height 5' (1.524 m), weight 63.5 kg, last menstrual period 10/09/2022, SpO2 100 %.  Physical Exam Vitals  reviewed.  Constitutional:      Appearance: Normal appearance.  Abdominal:     General: Abdomen is flat.     Palpations: Abdomen is soft.     Tenderness: There is no abdominal tenderness. There is no guarding or rebound.     Hernia: No hernia is present.  Neurological:     General: No focal deficit present.     Mental Status: She is alert.  Psychiatric:        Mood and Affect: Mood normal.        Behavior: Behavior normal.        Thought Content: Thought content normal.        Judgment: Judgment normal.    Results for orders placed or performed during the hospital encounter of 01/07/23 (from the past 24 hour(s))  Urinalysis, Routine w reflex microscopic -Urine, Clean Catch     Status: Abnormal   Collection Time: 01/07/23 11:05 AM  Result Value Ref Range   Color, Urine YELLOW YELLOW   APPearance CLEAR CLEAR   Specific Gravity, Urine <1.005 (L) 1.005 - 1.030   pH 6.5 5.0 -  8.0   Glucose, UA NEGATIVE NEGATIVE mg/dL   Hgb urine dipstick TRACE (A) NEGATIVE   Bilirubin Urine NEGATIVE NEGATIVE   Ketones, ur NEGATIVE NEGATIVE mg/dL   Protein, ur NEGATIVE NEGATIVE mg/dL   Nitrite NEGATIVE NEGATIVE   Leukocytes,Ua NEGATIVE NEGATIVE  Urinalysis, Microscopic (reflex)     Status: None   Collection Time: 01/07/23 11:05 AM  Result Value Ref Range   RBC / HPF 0-5 0 - 5 RBC/hpf   WBC, UA 0-5 0 - 5 WBC/hpf   Bacteria, UA NONE SEEN NONE SEEN   Squamous Epithelial / HPF 0-5 0 - 5 /HPF  CBC     Status: Abnormal   Collection Time: 01/07/23 11:26 AM  Result Value Ref Range   WBC 10.6 (H) 4.0 - 10.5 K/uL   RBC 3.87 3.87 - 5.11 MIL/uL   Hemoglobin 12.3 12.0 - 15.0 g/dL   HCT 35.1 (L) 36.0 - 46.0 %   MCV 90.7 80.0 - 100.0 fL   MCH 31.8 26.0 - 34.0 pg   MCHC 35.0 30.0 - 36.0 g/dL   RDW 12.1 11.5 - 15.5 %   Platelets 562 (H) 150 - 400 K/uL   nRBC 0.0 0.0 - 0.2 %    US OB Comp Less 14 Wks  Result Date: 01/07/2023 CLINICAL DATA:  37 year old pregnant female presents with cramping and  spotting. EDC by LMP: 07/16/2023, projecting to an expected gestational age of [redacted] weeks 6 days. Quantitative beta HCG pending. EXAM: OBSTETRIC <14 WK ULTRASOUND TECHNIQUE: Transabdominal ultrasound was performed for evaluation of the gestation as well as the maternal uterus and adnexal regions. COMPARISON:  No prior scans from this gestation. FINDINGS: Intrauterine gestational sac: None Maternal uterus/adnexae: Right upper uterine body intramural 2.9 x 2.0 x 3.0 cm fibroid. Bilayer endometrial thickness approximately 11 mm on limited transabdominal views, with no evidence of endometrial cavity fluid or focal endometrial mass. Right adnexal 7.9 x 4.4 x 4.5 cm mass with internal gestational sac with fetal pole with crown-rump length 25.9 mm, projecting to a gestational age of [redacted] weeks 3 days. No fetal cardiac activity on cine, M-mode or Doppler. A separate right ovary not visualized. Left ovary not visualized. No left adnexal masses. No abnormal free fluid in the pelvis. IMPRESSION: 1. Nonviable right tubal ectopic gestation with fetal pole with 25.9 mm crown-rump length, correlating with gestational age [redacted] weeks 3 days. No fetal cardiac activity. No abnormal free fluid in the pelvis. 2. No intrauterine gestation. 3. Right upper uterine body intramural 3.0 cm fibroid. Critical Value/emergent results were called by telephone at the time of interpretation on 01/07/2023 at 12:32 pm to provider Loma Boston , who verbally acknowledged these results. Electronically Signed   By: Ilona Sorrel M.D.   On: 01/07/2023 12:34     MAU Course  Procedures  MDM Imaging: I independent reviewed the images of the ultrasound. 42w4dectopic pregnancy in what appears to be the right adnexa. No intrauterine pregnancy seen. There is no cardiac activity seen. No fluid in the pelvis, but there does appear to be some blood in the tube. Entire pregnancy complex measuring 8cm x 4cm.    Assessment and Plan   1. Right tubal pregnancy  without intrauterine pregnancy    Patient discussed with radiologist Dr PPolly Cobiaregarding findings.  Discussed patient with Dr. CElly Modenawho also reviewed the images.  Due to the pregnancy size, will need surgical management. Prepare for OR.  JTruett Mainland2/19/2024, 12:37 PM

## 2023-01-07 NOTE — Anesthesia Procedure Notes (Signed)
Procedure Name: Intubation Date/Time: 01/07/2023 2:45 PM  Performed by: Timoteo Expose, CRNAPre-anesthesia Checklist: Patient identified, Emergency Drugs available, Suction available and Patient being monitored Patient Re-evaluated:Patient Re-evaluated prior to induction Oxygen Delivery Method: Circle system utilized Preoxygenation: Pre-oxygenation with 100% oxygen Induction Type: IV induction Ventilation: Mask ventilation without difficulty Laryngoscope Size: Mac and 3 Tube type: Oral Tube size: 6.5 mm Number of attempts: 1 Airway Equipment and Method: Stylet and Oral airway Placement Confirmation: ETT inserted through vocal cords under direct vision, positive ETCO2 and breath sounds checked- equal and bilateral Secured at: 20 cm Tube secured with: Tape Dental Injury: Teeth and Oropharynx as per pre-operative assessment

## 2023-01-07 NOTE — Anesthesia Preprocedure Evaluation (Signed)
Anesthesia Evaluation  Patient identified by MRN, date of birth, ID band Patient awake    Reviewed: Allergy & Precautions, NPO status , Patient's Chart, lab work & pertinent test results  Airway Mallampati: I       Dental no notable dental hx.    Pulmonary neg pulmonary ROS   Pulmonary exam normal        Cardiovascular negative cardio ROS Normal cardiovascular exam     Neuro/Psych negative neurological ROS  negative psych ROS   GI/Hepatic negative GI ROS, Neg liver ROS,,,  Endo/Other  negative endocrine ROS    Renal/GU negative Renal ROS  negative genitourinary   Musculoskeletal negative musculoskeletal ROS (+)    Abdominal  (+)  Abdomen: tender.   Peds  Hematology negative hematology ROS (+)   Anesthesia Other Findings   Reproductive/Obstetrics (+) Pregnancy                             Anesthesia Physical Anesthesia Plan  ASA: 2  Anesthesia Plan: General   Post-op Pain Management: Dilaudid IV   Induction: Intravenous, Rapid sequence and Cricoid pressure planned  PONV Risk Score and Plan: 4 or greater and Ondansetron, Dexamethasone and Midazolam  Airway Management Planned: Oral ETT  Additional Equipment: None  Intra-op Plan:   Post-operative Plan: Extubation in OR  Informed Consent: I have reviewed the patients History and Physical, chart, labs and discussed the procedure including the risks, benefits and alternatives for the proposed anesthesia with the patient or authorized representative who has indicated his/her understanding and acceptance.     Dental advisory given  Plan Discussed with: CRNA  Anesthesia Plan Comments:        Anesthesia Quick Evaluation

## 2023-01-07 NOTE — MAU Note (Signed)
.  Tammy Rojas is a 37 y.o. at 68w6dhere in MAU reporting: she was at the health. Dept today and they told her she has an ectopic pregnancy. Pt denies pain or bleeding.  Onset of complaint: today Pain score: 0/10 Vitals:   01/07/23 1106  BP: 125/79  Pulse: 72  Resp: 17  Temp: 97.9 F (36.6 C)  SpO2: 100%     Lab orders placed from triage:

## 2023-01-08 ENCOUNTER — Encounter (HOSPITAL_COMMUNITY): Payer: Self-pay | Admitting: Obstetrics and Gynecology

## 2023-01-09 LAB — SURGICAL PATHOLOGY

## 2023-01-21 ENCOUNTER — Encounter: Payer: Self-pay | Admitting: Obstetrics and Gynecology

## 2023-01-21 ENCOUNTER — Ambulatory Visit (INDEPENDENT_AMBULATORY_CARE_PROVIDER_SITE_OTHER): Payer: Self-pay | Admitting: Obstetrics and Gynecology

## 2023-01-21 VITALS — BP 128/84 | HR 80 | Ht <= 58 in | Wt 139.0 lb

## 2023-01-21 DIAGNOSIS — Z9889 Other specified postprocedural states: Secondary | ICD-10-CM

## 2023-01-21 NOTE — Progress Notes (Signed)
37 yo P3013 here for follow up on recent salpingectomy for the treatment of a ruptured ectopic pregnancy on 01/07/23. Patient reports feeling well. She reports minimal pain and has returned to her regular activities.   Past Medical History:  Diagnosis Date   Medical history non-contributory    No pertinent past medical history    Past Surgical History:  Procedure Laterality Date   APPENDECTOMY  2020   CESAREAN SECTION     LAPAROSCOPIC APPENDECTOMY N/A 06/07/2018   Procedure: APPENDECTOMY LAPAROSCOPIC;  Surgeon: Judeth Horn, MD;  Location: [place];  Service: General;  Laterality: N/A;   LAPAROSCOPIC UNILATERAL SALPINGECTOMY N/A 01/07/2023   Procedure: LAPAROSCOPIC RIGHT SALPINGECTOMY FOR ECTOPIC PREGNANCY;  Surgeon: Mora Bellman, MD;  Location: [place];  Service: Gynecology;  Laterality: N/A;   Family History  Problem Relation Age of Onset   Early death Mother    Diabetes Father    Social History   Tobacco Use   Smoking status: Never   Smokeless tobacco: Never  Vaping Use   Vaping Use: Never used  Substance Use Topics   Alcohol use: No   Drug use: No   ROS See pertinent in HPI. All other systems reviewed and non contributory Blood pressure 128/84, pulse 80, height '4\' 10"'$  (1.473 m), weight 139 lb (63 kg), last menstrual period 10/09/2022, unknown if currently breastfeeding.  GENERAL: Well-developed, well-nourished female in no acute distress.  ABDOMEN: Soft, nontender, nondistended. No organomegaly. Incision: healing well x 3 without erythema, induration or drainage EXTREMITIES: No cyanosis, clubbing, or edema, 2+ distal pulses.  A/P 37 yo here for post op check following right salpingectomy for treatment of ectopic pregnancy - Pathology results reviewed with the patient - Patient is medically cleared to resume all activities of daily living - Discussed contraception options and patient declined. She plans to try and conceive again. Patient advised to start taking prenatal  vitamins - patient reports normal pap smear with health department a year ago

## 2023-07-31 ENCOUNTER — Ambulatory Visit (INDEPENDENT_AMBULATORY_CARE_PROVIDER_SITE_OTHER): Payer: Medicaid Other | Admitting: Nurse Practitioner

## 2023-07-31 ENCOUNTER — Encounter: Payer: Self-pay | Admitting: Nurse Practitioner

## 2023-07-31 VITALS — BP 112/76 | HR 69 | Temp 97.0°F | Ht 59.0 in | Wt 145.5 lb

## 2023-07-31 DIAGNOSIS — L729 Follicular cyst of the skin and subcutaneous tissue, unspecified: Secondary | ICD-10-CM | POA: Insufficient documentation

## 2023-07-31 DIAGNOSIS — M79671 Pain in right foot: Secondary | ICD-10-CM | POA: Diagnosis not present

## 2023-07-31 DIAGNOSIS — R739 Hyperglycemia, unspecified: Secondary | ICD-10-CM | POA: Diagnosis not present

## 2023-07-31 DIAGNOSIS — M79672 Pain in left foot: Secondary | ICD-10-CM

## 2023-07-31 DIAGNOSIS — L819 Disorder of pigmentation, unspecified: Secondary | ICD-10-CM | POA: Diagnosis not present

## 2023-07-31 DIAGNOSIS — Z23 Encounter for immunization: Secondary | ICD-10-CM | POA: Diagnosis not present

## 2023-07-31 LAB — POCT GLYCOSYLATED HEMOGLOBIN (HGB A1C): Hemoglobin A1C: 5.5 % (ref 4.0–5.6)

## 2023-07-31 MED ORDER — IBUPROFEN 600 MG PO TABS: 600.0000 mg | ORAL_TABLET | Freq: Three times a day (TID) | ORAL | 0 refills | Status: DC | PRN

## 2023-07-31 NOTE — Assessment & Plan Note (Signed)
No discoloration noted on examination Patient encouraged to use sunscreen when under the sun Encouraged to keep skin well moisturized

## 2023-07-31 NOTE — Patient Instructions (Signed)
1. Need for influenza vaccination  - Flu vaccine trivalent PF, 6mos and older(Flulaval,Afluria,Fluarix,Fluzone)  2. Dermoid cyst of neck  - Ambulatory referral to Dermatology  3. Discoloration of skin  - Ambulatory referral to Dermatology  4. Heel pain, bilateral  - ibuprofen (ADVIL) 600 MG tablet; Take 1 tablet (600 mg total) by mouth every 8 (eight) hours as needed.  Dispense: 30 tablet; Refill: 0    It is important that you exercise regularly at least 30 minutes 5 times a week as tolerated  Think about what you will eat, plan ahead. Choose " clean, green, fresh or frozen" over canned, processed or packaged foods which are more sugary, salty and fatty. 70 to 75% of food eaten should be vegetables and fruit. Three meals at set times with snacks allowed between meals, but they must be fruit or vegetables. Aim to eat over a 12 hour period , example 7 am to 7 pm, and STOP after  your last meal of the day. Drink water,generally about 64 ounces per day, no other drink is as healthy. Fruit juice is best enjoyed in a healthy way, by EATING the fruit.  Thanks for choosing [facility] we consider it a privelige to serve you.

## 2023-07-31 NOTE — Assessment & Plan Note (Signed)
Patient educated on CDC recommendation for the vaccine. Verbal consent was obtained from the patient, vaccine administered by nurse, no sign of adverse reactions noted at this time. Patient education on arm soreness and use of tylenol or ibuprofen for this patient  was discussed. Patient educated on the signs and symptoms of adverse effect and advise to contact the office if they occur.  

## 2023-07-31 NOTE — Assessment & Plan Note (Signed)
Ibuprofen 600 mg 3 times daily as needed ordered encouraged to take with meals to avoid side effects. Patient encouraged to get good athletic shoes with soft insoles Application of heat or ice encouraged

## 2023-07-31 NOTE — Progress Notes (Signed)
New Patient Office Visit  Subjective:  Patient ID: Tammy Rojas, female    DOB: 1986-10-11  Age: 37 y.o. MRN: [id number]  CC:  Chief Complaint  Patient presents with   Establish Care    fasting    HPI Tammy Rojas is a 37 y.o. female has past medical history of acute pancreatitis.  Patient presents to establish care with a new provider.  Has no previous PCP  Patient complains of 2 cyst on the back of her neck that has been present for years.  Stated that she was treated with antibiotics in 2018 when the cyst was painful.  She currently does not have pain but reports a little pressure when the cyst is pressed on.  She denies fever, chills nausea vomiting diarrhea.   She also complains of skin discoloration of both arms since about a year ago.  She denies itching, she has no changed her skin care products.  She has been using sunscreen when out under the sun.  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  She has tried changing her shoes.  Does not take any medication.  No numbness, tingling.    Interpretation services provideed using a medical interpreter   Past Medical History:  Diagnosis Date   Medical history non-contributory    No pertinent past medical history     Past Surgical History:  Procedure Laterality Date   APPENDECTOMY  2020   CESAREAN SECTION     LAPAROSCOPIC APPENDECTOMY N/A 06/07/2018   Procedure: APPENDECTOMY LAPAROSCOPIC;  Surgeon: Jimmye Norman, MD;  Location: [facility] OR;  Service: General;  Laterality: N/A;   LAPAROSCOPIC UNILATERAL SALPINGECTOMY N/A 01/07/2023   Procedure: LAPAROSCOPIC RIGHT SALPINGECTOMY FOR ECTOPIC PREGNANCY;  Surgeon: Catalina Antigua, MD;  Location: [facility] OR;  Service: Gynecology;  Laterality: N/A;    Family History  Problem Relation Age of  Onset   Early death Mother    Diabetes Father     Social History   Socioeconomic History   Marital status: Married    Spouse name: Not on file   Number of children: 3   Years of education: Not on file   Highest education level: Not on file  Occupational History   Not on file  Tobacco Use   Smoking status: Never   Smokeless tobacco: Never  Vaping Use   Vaping status: Never Used  Substance and Sexual Activity   Alcohol use: No   Drug use: No   Sexual activity: Yes  Other Topics Concern   Not on file  Social History Narrative   Lives with her Husband and 3 children   Social Determinants of Health   Financial Resource Strain: Not on file  Food Insecurity: No Food Insecurity (07/17/2022)   Hunger Vital Sign    Worried About Running Out of Food in the Last Year: Never true    Ran Out of Food in the Last Year: Never true  Transportation Needs: No Transportation Needs (07/17/2022)   PRAPARE - Administrator, Civil Service (Medical): No    Lack of Transportation (Non-Medical): No  Physical Activity: Not on file  Stress: Not on file  Social Connections: Not on file  Intimate Partner Violence: Not on file    ROS Review of Systems  Constitutional:  Negative for activity change, appetite change, chills, fatigue and fever.  HENT:  Negative for congestion, dental problem, ear discharge, ear pain, hearing loss, rhinorrhea, sinus pressure, sinus pain, sneezing and sore throat.   Eyes:  Negative for pain, discharge, redness and itching.  Respiratory:  Negative for cough, chest tightness, shortness of breath and wheezing.   Cardiovascular:  Negative for chest pain, palpitations and leg swelling.  Gastrointestinal:  Negative for abdominal distention, abdominal pain, anal bleeding, blood in stool, constipation, diarrhea, nausea, rectal pain and vomiting.  Endocrine: Negative for cold intolerance, heat intolerance, polydipsia, polyphagia and polyuria.  Genitourinary:   Negative for difficulty urinating, dysuria, flank pain, frequency, hematuria, menstrual problem, pelvic pain and vaginal bleeding.  Musculoskeletal:  Positive for arthralgias. Negative for back pain, gait problem, joint swelling and myalgias.  Skin:  Negative for color change, pallor, rash and wound.  Allergic/Immunologic: Negative for environmental allergies, food allergies and immunocompromised state.  Neurological:  Negative for dizziness, tremors, facial asymmetry, weakness and headaches.  Hematological:  Negative for adenopathy. Does not bruise/bleed easily.  Psychiatric/Behavioral:  Negative for agitation, behavioral problems, confusion, decreased concentration, hallucinations, self-injury and suicidal ideas.     Objective:   Today's Vitals: BP 112/76   Pulse 69   Temp (!) 97 F (36.1 C)   Ht 4\' 11"  (1.499 m)   Wt 145 lb 8 oz (66 kg)   LMP 10/09/2022   SpO2 100%   BMI 29.39 kg/m   Physical Exam Vitals and nursing note reviewed.  Constitutional:      General: She is not in acute distress.    Appearance: Normal appearance. She is not ill-appearing, toxic-appearing or diaphoretic.  HENT:     Mouth/Throat:     Mouth: Mucous membranes are moist.     Pharynx: Oropharynx is clear. No oropharyngeal exudate or posterior oropharyngeal erythema.  Eyes:     General: No scleral icterus.       Right eye: No discharge.        Left eye: No discharge.     Extraocular Movements: Extraocular movements intact.     Conjunctiva/sclera: Conjunctivae normal.  Cardiovascular:     Rate and Rhythm: Normal rate and regular rhythm.     Pulses: Normal pulses.     Heart sounds: Normal heart sounds. No murmur heard.    No friction rub. No gallop.  Pulmonary:     Effort: Pulmonary effort is normal. No respiratory distress.     Breath sounds: Normal breath sounds. No stridor. No wheezing, rhonchi or rales.  Chest:     Chest wall: No tenderness.  Abdominal:     General: There is no distension.      Palpations: Abdomen is soft.     Tenderness: There is no abdominal tenderness. There is no right CVA tenderness, left CVA tenderness or guarding.  Musculoskeletal:        General: No swelling, tenderness, deformity or signs of injury.     Right lower leg: No edema.     Left lower leg: No edema.     Comments: Tenderness on palpation of bilateral heel.  Skin warm and dry no redness or swelling noted.  Has palpable pedal pulses  Skin:    General: Skin is warm and dry.     Capillary Refill: Capillary refill takes less than 2 seconds.     Coloration: Skin is not jaundiced or pale.     Findings: No bruising, erythema or lesion.     Comments: To firm  mobile cysts noted around the posterior neck.  No tenderness, redness or drainage noted  Neurological:     Mental Status: She is alert and oriented to person, place, and time.     Motor: No weakness.     Coordination: Coordination normal.     Gait: Gait normal.  Psychiatric:        Mood and Affect: Mood normal.        Behavior: Behavior normal.        Thought Content: Thought content normal.        Judgment: Judgment normal.     Assessment & Plan:   Problem List Items Addressed This Visit       Musculoskeletal and Integument   Cyst of skin - Primary    Cyst not infected Patient  desires to have cysts removed Patient referred to dermatology      Relevant Orders   Ambulatory referral to Dermatology     Other   Need for influenza vaccination    Patient educated on CDC recommendation for the vaccine. Verbal consent was obtained from the patient, vaccine administered by nurse, no sign of adverse reactions noted at this time. Patient education on arm soreness and use of tylenol or ibuprofen for this patient  was discussed. Patient educated on the signs and symptoms of adverse effect and advise to contact the office if they occur.       Relevant Orders   Flu vaccine trivalent PF, 6mos and older(Flulaval,Afluria,Fluarix,Fluzone)  (Completed)   Heel pain, bilateral    Ibuprofen 600 mg 3 times daily as needed ordered encouraged to take with meals to avoid side effects. Patient encouraged to get good athletic shoes with soft insoles Application of heat or ice encouraged      Relevant Medications   ibuprofen (ADVIL) 600 MG tablet   Discoloration of skin    No discoloration noted on examination Patient encouraged to use sunscreen when under the sun Encouraged to keep skin well moisturized      Relevant Orders   Ambulatory referral to Dermatology   Blood glucose elevated    Negative for type 2 diabetes Lab Results  Component Value Date   HGBA1C 5.5 07/31/2023         Relevant Orders   POCT glycosylated hemoglobin (Hb A1C) (Completed)    Outpatient Encounter Medications as of 07/31/2023  Medication Sig   ibuprofen (ADVIL) 600 MG tablet Take 1 tablet (600 mg total) by mouth every 8 (eight) hours as needed.   [DISCONTINUED] ibuprofen (ADVIL) 600 MG tablet Take 1 tablet (600 mg total) by mouth every 6 (six) hours as needed. (Patient not taking: Reported on 07/31/2023)   [DISCONTINUED] ibuprofen (ADVIL) 800 MG tablet Take 800 mg by mouth every 6 (six) hours as needed. (Patient not taking: Reported on 07/31/2023)   [DISCONTINUED] oxyCODONE-acetaminophen (PERCOCET/ROXICET) 5-325 MG tablet Take 1 tablet by mouth every 6 (six) hours as needed. (Patient not taking: Reported on 01/21/2023)   [DISCONTINUED] Prenatal Vit-Fe Fumarate-FA (PRENATAL MULTIVITAMIN) TABS Take 1 tablet by mouth daily. (Patient not taking: Reported on 07/31/2023)   No facility-administered encounter medications on file as of 07/31/2023.    Follow-up: Return in about 5 months (around 12/31/2023) for CPE.   Donell Beers, FNP

## 2023-07-31 NOTE — Assessment & Plan Note (Signed)
Cyst not infected Patient  desires to have cysts removed Patient referred to dermatology

## 2023-07-31 NOTE — Assessment & Plan Note (Signed)
Negative for type 2 diabetes Lab Results  Component Value Date   HGBA1C 5.5 07/31/2023

## 2023-12-19 ENCOUNTER — Inpatient Hospital Stay (HOSPITAL_COMMUNITY): Payer: Medicaid Other

## 2023-12-19 ENCOUNTER — Encounter (HOSPITAL_COMMUNITY): Payer: Self-pay | Admitting: *Deleted

## 2023-12-19 ENCOUNTER — Inpatient Hospital Stay (HOSPITAL_COMMUNITY)
Admission: AD | Admit: 2023-12-19 | Discharge: 2023-12-19 | Disposition: A | Discharge status: Home / Self Care | Payer: Medicaid Other | Attending: Obstetrics and Gynecology | Admitting: Obstetrics and Gynecology

## 2023-12-19 DIAGNOSIS — O26891 Other specified pregnancy related conditions, first trimester: Secondary | ICD-10-CM | POA: Diagnosis not present

## 2023-12-19 DIAGNOSIS — R109 Unspecified abdominal pain: Secondary | ICD-10-CM | POA: Insufficient documentation

## 2023-12-19 DIAGNOSIS — O368390 Maternal care for abnormalities of the fetal heart rate or rhythm, unspecified trimester, not applicable or unspecified: Secondary | ICD-10-CM

## 2023-12-19 DIAGNOSIS — Z3A01 Less than 8 weeks gestation of pregnancy: Secondary | ICD-10-CM | POA: Diagnosis not present

## 2023-12-19 DIAGNOSIS — O209 Hemorrhage in early pregnancy, unspecified: Secondary | ICD-10-CM | POA: Diagnosis not present

## 2023-12-19 DIAGNOSIS — O09521 Supervision of elderly multigravida, first trimester: Secondary | ICD-10-CM | POA: Diagnosis not present

## 2023-12-19 LAB — CBC
HCT: 38.3 % (ref 36.0–46.0)
Hemoglobin: 13 g/dL (ref 12.0–15.0)
MCH: 31.4 pg (ref 26.0–34.0)
MCHC: 33.9 g/dL (ref 30.0–36.0)
MCV: 92.5 fL (ref 80.0–100.0)
Platelets: 342 10*3/uL (ref 150–400)
RBC: 4.14 MIL/uL (ref 3.87–5.11)
RDW: 12.6 % (ref 11.5–15.5)
WBC: 12.6 10*3/uL — ABNORMAL HIGH (ref 4.0–10.5)
nRBC: 0 % (ref 0.0–0.2)

## 2023-12-19 LAB — COMPREHENSIVE METABOLIC PANEL
ALT: 56 U/L — ABNORMAL HIGH (ref 0–44)
AST: 35 U/L (ref 15–41)
Albumin: 3.8 g/dL (ref 3.5–5.0)
Alkaline Phosphatase: 83 U/L (ref 38–126)
Anion gap: 7 (ref 5–15)
BUN: 9 mg/dL (ref 6–20)
CO2: 23 mmol/L (ref 22–32)
Calcium: 9.4 mg/dL (ref 8.9–10.3)
Chloride: 105 mmol/L (ref 98–111)
Creatinine, Ser: 0.6 mg/dL (ref 0.44–1.00)
GFR, Estimated: >60 mL/min (ref >60)
Glucose, Bld: 104 mg/dL — ABNORMAL HIGH (ref 70–99)
Potassium: 4 mmol/L (ref 3.5–5.1)
Sodium: 135 mmol/L (ref 135–145)
Total Bilirubin: 0.4 mg/dL (ref 0.0–1.2)
Total Protein: 7.1 g/dL (ref 6.5–8.1)

## 2023-12-19 LAB — HCG, QUANTITATIVE, PREGNANCY: hCG, Beta Chain, Quant, S: 45872 m[IU]/mL — ABNORMAL HIGH (ref <5)

## 2023-12-19 LAB — WET PREP, GENITAL
Clue Cells Wet Prep HPF POC: NONE SEEN
Sperm: NONE SEEN
Trich, Wet Prep: NONE SEEN
WBC, Wet Prep HPF POC: <10 (ref <10)
Yeast Wet Prep HPF POC: NONE SEEN

## 2023-12-19 LAB — POCT PREGNANCY, URINE: Preg Test, Ur: POSITIVE — AB

## 2023-12-19 NOTE — MAU Note (Signed)
.  Sydna Brodowski is a 38 y.o. at Unknown here in MAU reporting some abd pain yesterday but no pain tonight. When she wiped tonight she had some light vag bleeding. Hx ectopic in 2024 and R fallopian tub removed  LMP: 10/13/23 Onset of complaint: toniight Pain score: 0  B/P 126 77 P 65 R 17 T- 97.6 FHT: n/a  Lab orders placed from triage: upt u/a

## 2023-12-19 NOTE — MAU Provider Note (Signed)
History     CSN: [id number]  Arrival date and time: 12/19/23 1805   Event Date/Time   First Provider Initiated Contact with Patient 12/19/23 2233      Chief Complaint  Patient presents with   Vaginal Bleeding   Abdominal Pain    Tammy Rojas is a 38 y.o. [id number] at [redacted]w[redacted]d by Definite LMP of 10/13/2023.  She presents today for abdominal pain.  She states the pain was present yesterday and she describes it as stabbing pain that was strong and constant for about 2 hours, then mild for the remainder of the day.  She states she did not take any medications.  She reports tonight she had some spotting with wiping. She states it was a small amount that was not noted later. She expresses concern for history of ectopic pregnancy and wants to make sure everything is okay.   OB History     Gravida  8   Para  3   Term  3   Preterm  0   AB  2   Living  3      SAB  1   IAB  0   Ectopic  1   Multiple  0   Live Births  1           Past Medical History:  Diagnosis Date   Medical history non-contributory    No pertinent past medical history     Past Surgical History:  Procedure Laterality Date   APPENDECTOMY  2020   CESAREAN SECTION     LAPAROSCOPIC APPENDECTOMY N/A 06/07/2018   Procedure: APPENDECTOMY LAPAROSCOPIC;  Surgeon: Jimmye Norman, MD;  Location: [facility] OR;  Service: General;  Laterality: N/A;   LAPAROSCOPIC UNILATERAL SALPINGECTOMY N/A 01/07/2023   Procedure: LAPAROSCOPIC RIGHT SALPINGECTOMY FOR ECTOPIC PREGNANCY;  Surgeon: Catalina Antigua, MD;  Location: [facility] OR;  Service: Gynecology;  Laterality: N/A;    Family History  Problem Relation Age of Onset   Early death Mother    Diabetes Father     Social History   Tobacco Use   Smoking status: Never   Smokeless tobacco: Never  Vaping Use   Vaping status: Never Used  Substance Use Topics   Alcohol use: No   Drug use: No    Allergies: No Known Allergies  Medications Prior to Admission  Medication  Sig Dispense Refill Last Dose/Taking   ibuprofen (ADVIL) 600 MG tablet Take 1 tablet (600 mg total) by mouth every 8 (eight) hours as needed. 30 tablet 0     Review of Systems  Gastrointestinal:  Positive for abdominal pain and nausea (None currently). Negative for constipation, diarrhea and vomiting. Abdominal distention: None currently. Genitourinary:  Positive for vaginal bleeding (Spotting x 1). Negative for difficulty urinating and dysuria.   Physical Exam   Blood pressure 126/77, pulse 65, temperature 97.6 F (36.4 C), resp. rate 17, height 4\' 11"  (1.499 m), weight 67.6 kg, last menstrual period 10/13/2023, SpO2 100%, unknown if currently breastfeeding.  Physical Exam Vitals and nursing note reviewed.  Constitutional:      Appearance: Normal appearance.  HENT:     Head: Normocephalic and atraumatic.  Eyes:     Conjunctiva/sclera: Conjunctivae normal.  Cardiovascular:     Rate and Rhythm: Normal rate.  Pulmonary:     Effort: Pulmonary effort is normal. No respiratory distress.  Musculoskeletal:     Cervical back: Normal range of motion.  Skin:    General: Skin is warm and dry.  Neurological:  Mental Status: She is alert and oriented to person, place, and time.  Psychiatric:        Mood and Affect: Mood normal.        Behavior: Behavior normal.     MAU Course  Procedures Results for orders placed or performed during the hospital encounter of 12/19/23 (from the past 24 hours)  Pregnancy, urine POC     Status: Abnormal   Collection Time: 12/19/23  8:24 PM  Result Value Ref Range   Preg Test, Ur POSITIVE (A) NEGATIVE  Wet prep, genital     Status: None   Collection Time: 12/19/23  8:26 PM  Result Value Ref Range   Yeast Wet Prep HPF POC NONE SEEN NONE SEEN   Trich, Wet Prep NONE SEEN NONE SEEN   Clue Cells Wet Prep HPF POC NONE SEEN NONE SEEN   WBC, Wet Prep HPF POC <10 <10   Sperm NONE SEEN   CBC     Status: Abnormal   Collection Time: 12/19/23  8:34 PM   Result Value Ref Range   WBC 12.6 (H) 4.0 - 10.5 K/uL   RBC 4.14 3.87 - 5.11 MIL/uL   Hemoglobin 13.0 12.0 - 15.0 g/dL   HCT 82.9 56.2 - 13.0 %   MCV 92.5 80.0 - 100.0 fL   MCH 31.4 26.0 - 34.0 pg   MCHC 33.9 30.0 - 36.0 g/dL   RDW 86.5 78.4 - 69.6 %   Platelets 342 150 - 400 K/uL   nRBC 0.0 0.0 - 0.2 %  hCG, quantitative, pregnancy     Status: Abnormal   Collection Time: 12/19/23  8:34 PM  Result Value Ref Range   hCG, Beta Chain, Quant, S 45,872 (H) <5 mIU/mL  Comprehensive metabolic panel     Status: Abnormal   Collection Time: 12/19/23  8:34 PM  Result Value Ref Range   Sodium 135 135 - 145 mmol/L   Potassium 4.0 3.5 - 5.1 mmol/L   Chloride 105 98 - 111 mmol/L   CO2 23 22 - 32 mmol/L   Glucose, Bld 104 (H) 70 - 99 mg/dL   BUN 9 6 - 20 mg/dL   Creatinine, Ser 2.95 0.44 - 1.00 mg/dL   Calcium 9.4 8.9 - 28.4 mg/dL   Total Protein 7.1 6.5 - 8.1 g/dL   Albumin 3.8 3.5 - 5.0 g/dL   AST 35 15 - 41 U/L   ALT 56 (H) 0 - 44 U/L   Alkaline Phosphatase 83 38 - 126 U/L   Total Bilirubin 0.4 0.0 - 1.2 mg/dL   GFR, Estimated >13 >24 mL/min   Anion gap 7 5 - 15   US OB LESS THAN 14 WEEKS WITH OB TRANSVAGINAL Result Date: 12/19/2023 CLINICAL DATA:  Abdominal pain in pregnancy EXAM: OBSTETRIC <14 WK [place] AND TRANSVAGINAL OB US TECHNIQUE: Both transabdominal and transvaginal ultrasound examinations were performed for complete evaluation of the gestation as well as the maternal uterus, adnexal regions, and pelvic cul-de-sac. Transvaginal technique was performed to assess early pregnancy. COMPARISON:  None Available. FINDINGS: Intrauterine gestational sac: Single Yolk sac:  Visualized. Embryo:  Visualized. Cardiac Activity: Visualized. Heart Rate: 86 bpm CRL:  5.2 mm   6 w   1 d                  [place] EDC: 08/12/2024 Subchorionic hemorrhage:  None visualized. Maternal uterus/adnexae: Normal ovaries bilaterally. No free fluid in the pelvis. There is a 2.5 cm fibroid in the uterine  fundus. IMPRESSION:  Single live intrauterine gestation.  No subchorionic hemorrhage. Electronically Signed   By: Annia Belt M.D.   On: 12/19/2023 21:59    MDM PE Cultures: Wet Prep and GC/CT Labs: UA, UPT, CBC, CMP, hCG, ABO Ultrasound Interpreter Use Coordination of F/U Care Assessment and Plan  38 year old, [id number]  SIUP at 6.1 weeks Abdominal Pain-Resolved Bleeding-Resolved  -Orders placed in triage. -Results as above.  -Exam performed.  -Reviewed results. -Discussed need to follow up for fetal bradycardia.  [place] order placed.  -Bleeding precautions given.  -Questions regarding restrictions (none advised) given.  -Discharged to home in stable condition. -Interpretations completed with assistance of video interpreter: [place] [phone].   Cherre Robins MSN, CNM Advanced Practice Provider, Center for [facility] 12/19/2023, 10:33 PM

## 2023-12-20 LAB — GC/CHLAMYDIA PROBE AMP (~~LOC~~) NOT AT ARMC
Chlamydia: NEGATIVE
Comment: NEGATIVE
Comment: NORMAL
Neisseria Gonorrhea: NEGATIVE

## 2023-12-25 ENCOUNTER — Encounter: Payer: Self-pay | Admitting: Nurse Practitioner

## 2023-12-25 ENCOUNTER — Ambulatory Visit (INDEPENDENT_AMBULATORY_CARE_PROVIDER_SITE_OTHER): Payer: Medicaid Other | Admitting: Nurse Practitioner

## 2023-12-25 VITALS — BP 103/60 | HR 72 | Temp 97.0°F | Wt 146.0 lb

## 2023-12-25 DIAGNOSIS — Z0000 Encounter for general adult medical examination without abnormal findings: Secondary | ICD-10-CM | POA: Diagnosis not present

## 2023-12-25 DIAGNOSIS — Z3491 Encounter for supervision of normal pregnancy, unspecified, first trimester: Secondary | ICD-10-CM | POA: Diagnosis not present

## 2023-12-25 DIAGNOSIS — Z1159 Encounter for screening for other viral diseases: Secondary | ICD-10-CM

## 2023-12-25 DIAGNOSIS — Z13220 Encounter for screening for lipoid disorders: Secondary | ICD-10-CM | POA: Diagnosis not present

## 2023-12-25 NOTE — Patient Instructions (Addendum)
 [facility] for [facility] at [facility] [phone]   It is important that you exercise regularly at least 30 minutes 5 times a week as tolerated  Think about what you will eat, plan ahead. Choose " clean, green, fresh or frozen" over canned, processed or packaged foods which are more sugary, salty and fatty. 70 to 75% of food eaten should be vegetables and fruit. Three meals at set times with snacks allowed between meals, but they must be fruit or vegetables. Aim to eat over a 12 hour period , example 7 am to 7 pm, and STOP after  your last meal of the day. Drink water,generally about 64 ounces per day, no other drink is as healthy. Fruit juice is best enjoyed in a healthy way, by EATING the fruit.  Thanks for choosing [facility] we consider it a privelige to serve you.

## 2023-12-25 NOTE — Assessment & Plan Note (Addendum)
 Annual exam as documented.  Counseling done include healthy lifestyle involving committing to 150 minutes of exercise per week, heart healthy diet, and attaining healthy weight. The importance of adequate sleep also discussed.  Regular use of seat belt and home safety were also discussed . Changes in health habits are decided on by patient with goals and time frames set for achieving them. Immunization and cancer screening  needs are specifically addressed at this visit.    Need for cervical cancer screening discussed patient stated that she has had a Pap smear done in the past 5 years   Screening for hyperlipidemia  - Lipid panel; Future  . Need for hepatitis C screening test  - Hepatitis C antibody; Future

## 2023-12-25 NOTE — Assessment & Plan Note (Signed)
 Continue prenatal vitamins daily Encouraged to follow-up with her OB/GYN and get the ultrasound ordered done as planned

## 2023-12-25 NOTE — Progress Notes (Signed)
 Established Patient Office Visit  Subjective:  Patient ID: Tammy Rojas, female    DOB: 1986-07-24  Age: 38 y.o. MRN: [id number]  CC: No chief complaint on file.   HPI Tammy Rojas is a 38 y.o. female who presents for hospitalization follow-up and for a CPE. Patient had presented today emergency department on 12/19/2023 for complaints of abdominal pain.  She is in her first trimester of pregnancy, ultrasound was done at emergency room which showed fetal bradycardia, there was a recommendation to have a repeat ultrasound done which is coming up on 01/02/2024.  She currently denies abdominal pain nausea, vomiting, vaginal bleeding.  Taking prenatal vitamins.  She is on a general diet, does walking exercises sometimes,  she reports sleeping well and feels well generally.  She denies any complaints today.    Past Medical History:  Diagnosis Date   Medical history non-contributory    No pertinent past medical history     Past Surgical History:  Procedure Laterality Date   APPENDECTOMY  2020   CESAREAN SECTION     LAPAROSCOPIC APPENDECTOMY N/A 06/07/2018   Procedure: APPENDECTOMY LAPAROSCOPIC;  Surgeon: Kimble Agent, MD;  Location: [facility] OR;  Service: General;  Laterality: N/A;   LAPAROSCOPIC UNILATERAL SALPINGECTOMY N/A 01/07/2023   Procedure: LAPAROSCOPIC RIGHT SALPINGECTOMY FOR ECTOPIC PREGNANCY;  Surgeon: Alger Gong, MD;  Location: [facility] OR;  Service: Gynecology;  Laterality: N/A;    Family History  Problem Relation Age of Onset   Early death Mother    Diabetes Father     Social History   Socioeconomic History   Marital status: Married    Spouse name: Not on file   Number of children: 3   Years of education: Not on file   Highest education level: Not on file  Occupational History   Not on file  Tobacco Use   Smoking status: Never   Smokeless tobacco: Never  Vaping Use   Vaping status: Never Used  Substance and Sexual Activity   Alcohol use: No   Drug  use: No   Sexual activity: Yes  Other Topics Concern   Not on file  Social History Narrative   Lives with her Husband and 3 children   Social Drivers of Corporate Investment Banker Strain: Not on file  Food Insecurity: No Food Insecurity (07/17/2022)   Hunger Vital Sign    Worried About Running Out of Food in the Last Year: Never true    Ran Out of Food in the Last Year: Never true  Transportation Needs: No Transportation Needs (07/17/2022)   PRAPARE - Administrator, Civil Service (Medical): No    Lack of Transportation (Non-Medical): No  Physical Activity: Not on file  Stress: Not on file  Social Connections: Not on file  Intimate Partner Violence: Not on file    Outpatient Medications Prior to Visit  Medication Sig Dispense Refill   Prenatal Vit-Fe Fumarate-FA (PRENATAL VITAMINS PO) Take by mouth.     No facility-administered medications prior to visit.    No Known Allergies  ROS Review of Systems  Constitutional:  Negative for appetite change, chills, fatigue and fever.  HENT:  Negative for congestion, postnasal drip, rhinorrhea and sneezing.   Eyes:  Negative for pain, discharge, redness and itching.  Respiratory:  Negative for cough, shortness of breath and wheezing.   Cardiovascular:  Negative for chest pain, palpitations and leg swelling.  Gastrointestinal:  Negative for abdominal pain, constipation, nausea and vomiting.  Endocrine:  Negative for cold intolerance, heat intolerance and polydipsia.  Genitourinary:  Negative for difficulty urinating, dysuria, flank pain and frequency.  Musculoskeletal:  Negative for arthralgias, back pain, joint swelling and myalgias.  Skin:  Negative for color change, pallor, rash and wound.  Neurological:  Negative for dizziness, facial asymmetry, weakness, numbness and headaches.  Psychiatric/Behavioral:  Negative for behavioral problems, confusion, self-injury and suicidal ideas.       Objective:    Physical  Exam Vitals and nursing note reviewed. Exam conducted with a chaperone present.  Constitutional:      General: She is not in acute distress.    Appearance: Normal appearance. She is not ill-appearing, toxic-appearing or diaphoretic.  HENT:     Right Ear: Tympanic membrane, ear canal and external ear normal. There is no impacted cerumen.     Left Ear: Tympanic membrane, ear canal and external ear normal. There is no impacted cerumen.     Nose: Nose normal. No congestion or rhinorrhea.     Mouth/Throat:     Mouth: Mucous membranes are moist.     Pharynx: Oropharynx is clear. No oropharyngeal exudate or posterior oropharyngeal erythema.  Eyes:     General: No scleral icterus.       Right eye: No discharge.        Left eye: No discharge.     Extraocular Movements: Extraocular movements intact.     Conjunctiva/sclera: Conjunctivae normal.  Neck:     Vascular: No carotid bruit.  Cardiovascular:     Rate and Rhythm: Normal rate and regular rhythm.     Pulses: Normal pulses.     Heart sounds: Normal heart sounds. No murmur heard.    No friction rub. No gallop.  Pulmonary:     Effort: Pulmonary effort is normal. No respiratory distress.     Breath sounds: Normal breath sounds. No stridor. No wheezing, rhonchi or rales.  Chest:     Chest wall: No mass, lacerations, deformity, swelling, tenderness, crepitus or edema.  Breasts:    Tanner Score is 5.     Breasts are symmetrical.     Right: Normal. No swelling, bleeding, inverted nipple, mass, nipple discharge, skin change or tenderness.     Left: Normal. No swelling, bleeding, inverted nipple, mass, nipple discharge, skin change or tenderness.  Abdominal:     General: Bowel sounds are normal. There is no distension.     Palpations: Abdomen is soft. There is no mass.     Tenderness: There is no abdominal tenderness. There is no right CVA tenderness, left CVA tenderness, guarding or rebound.     Hernia: No hernia is present.   Musculoskeletal:        General: No swelling, tenderness, deformity or signs of injury.     Cervical back: Normal range of motion and neck supple. No rigidity or tenderness.     Right lower leg: No edema.     Left lower leg: No edema.  Lymphadenopathy:     Cervical: No cervical adenopathy.     Upper Body:     Right upper body: No supraclavicular, axillary or pectoral adenopathy.     Left upper body: No supraclavicular, axillary or pectoral adenopathy.  Skin:    General: Skin is warm and dry.     Capillary Refill: Capillary refill takes less than 2 seconds.     Coloration: Skin is not jaundiced or pale.     Findings: No bruising, erythema, lesion or rash.  Neurological:  Mental Status: She is alert and oriented to person, place, and time.     Cranial Nerves: No cranial nerve deficit.     Sensory: No sensory deficit.     Motor: No weakness.     Coordination: Coordination normal.     Gait: Gait normal.     Deep Tendon Reflexes: Reflexes normal.  Psychiatric:        Mood and Affect: Mood normal.        Behavior: Behavior normal.        Thought Content: Thought content normal.        Judgment: Judgment normal.     BP 103/60   Pulse 72   Temp (!) 97 F (36.1 C)   Wt 146 lb (66.2 kg)   LMP 10/13/2023   SpO2 100%   BMI 29.49 kg/m  Wt Readings from Last 3 Encounters:  12/25/23 146 lb (66.2 kg)  12/19/23 149 lb (67.6 kg)  07/31/23 145 lb 8 oz (66 kg)    No results found for: TSH Lab Results  Component Value Date   WBC 12.6 (H) 12/19/2023   HGB 13.0 12/19/2023   HCT 38.3 12/19/2023   MCV 92.5 12/19/2023   PLT 342 12/19/2023   Lab Results  Component Value Date   NA 135 12/19/2023   K 4.0 12/19/2023   CO2 23 12/19/2023   GLUCOSE 104 (H) 12/19/2023   BUN 9 12/19/2023   CREATININE 0.60 12/19/2023   BILITOT 0.4 12/19/2023   ALKPHOS 83 12/19/2023   AST 35 12/19/2023   ALT 56 (H) 12/19/2023   PROT 7.1 12/19/2023   ALBUMIN  3.8 12/19/2023   CALCIUM 9.4  12/19/2023   ANIONGAP 7 12/19/2023   No results found for: CHOL No results found for: HDL No results found for: LDLCALC No results found for: TRIG No results found for: CHOLHDL Lab Results  Component Value Date   HGBA1C 5.5 07/31/2023      Assessment & Plan:   Problem List Items Addressed This Visit       Other   First trimester pregnancy   Continue prenatal vitamins daily Encouraged to follow-up with her OB/GYN and get the ultrasound ordered done as planned      Annual physical exam   Annual exam as documented.  Counseling done include healthy lifestyle involving committing to 150 minutes of exercise per week, heart healthy diet, and attaining healthy weight. The importance of adequate sleep also discussed.  Regular use of seat belt and home safety were also discussed . Changes in health habits are decided on by patient with goals and time frames set for achieving them. Immunization and cancer screening  needs are specifically addressed at this visit.    Need for cervical cancer screening discussed patient stated that she has had a Pap smear done in the past 5 years   Screening for hyperlipidemia  - Lipid panel; Future  . Need for hepatitis C screening test  - Hepatitis C antibody; Future        Other Visit Diagnoses       Need for hepatitis C screening test    -  Primary   Relevant Orders   Hepatitis C antibody     Screening for hyperlipidemia       Relevant Orders   Lipid panel       No orders of the defined types were placed in this encounter.   Follow-up: Return in about 1 year (around 12/24/2024).    Jahquez Steffler R  Tiasha Helvie, FNP

## 2023-12-31 ENCOUNTER — Encounter: Payer: Self-pay | Admitting: Nurse Practitioner

## 2023-12-31 ENCOUNTER — Ambulatory Visit: Payer: Self-pay | Admitting: Nurse Practitioner

## 2024-01-02 ENCOUNTER — Ambulatory Visit (HOSPITAL_COMMUNITY)
Admission: RE | Admit: 2024-01-02 | Discharge: 2024-01-02 | Disposition: A | Discharge status: Home / Self Care | Payer: Medicaid Other | Source: Ambulatory Visit

## 2024-01-02 DIAGNOSIS — O209 Hemorrhage in early pregnancy, unspecified: Secondary | ICD-10-CM | POA: Insufficient documentation

## 2024-01-02 DIAGNOSIS — O368390 Maternal care for abnormalities of the fetal heart rate or rhythm, unspecified trimester, not applicable or unspecified: Secondary | ICD-10-CM | POA: Insufficient documentation

## 2024-01-02 DIAGNOSIS — Z3A01 Less than 8 weeks gestation of pregnancy: Secondary | ICD-10-CM | POA: Insufficient documentation

## 2024-01-03 ENCOUNTER — Encounter (HOSPITAL_COMMUNITY): Payer: Self-pay | Admitting: Obstetrics and Gynecology

## 2024-01-03 ENCOUNTER — Inpatient Hospital Stay (HOSPITAL_COMMUNITY)
Admission: AD | Admit: 2024-01-03 | Discharge: 2024-01-03 | Disposition: A | Discharge status: Home / Self Care | Payer: Medicaid Other | Attending: Obstetrics and Gynecology | Admitting: Obstetrics and Gynecology

## 2024-01-03 DIAGNOSIS — O3411 Maternal care for benign tumor of corpus uteri, first trimester: Secondary | ICD-10-CM | POA: Diagnosis not present

## 2024-01-03 DIAGNOSIS — D259 Leiomyoma of uterus, unspecified: Secondary | ICD-10-CM | POA: Insufficient documentation

## 2024-01-03 DIAGNOSIS — Z3A01 Less than 8 weeks gestation of pregnancy: Secondary | ICD-10-CM

## 2024-01-03 DIAGNOSIS — O368390 Maternal care for abnormalities of the fetal heart rate or rhythm, unspecified trimester, not applicable or unspecified: Secondary | ICD-10-CM | POA: Insufficient documentation

## 2024-01-03 DIAGNOSIS — O200 Threatened abortion: Secondary | ICD-10-CM | POA: Insufficient documentation

## 2024-01-03 DIAGNOSIS — O09291 Supervision of pregnancy with other poor reproductive or obstetric history, first trimester: Secondary | ICD-10-CM | POA: Diagnosis not present

## 2024-01-03 DIAGNOSIS — O3680X0 Pregnancy with inconclusive fetal viability, not applicable or unspecified: Secondary | ICD-10-CM | POA: Diagnosis not present

## 2024-01-03 DIAGNOSIS — O09521 Supervision of elderly multigravida, first trimester: Secondary | ICD-10-CM | POA: Insufficient documentation

## 2024-01-03 DIAGNOSIS — Z3A08 8 weeks gestation of pregnancy: Secondary | ICD-10-CM | POA: Insufficient documentation

## 2024-01-03 DIAGNOSIS — O26899 Other specified pregnancy related conditions, unspecified trimester: Secondary | ICD-10-CM

## 2024-01-03 HISTORY — DX: Benign neoplasm of connective and other soft tissue, unspecified: D21.9

## 2024-01-03 NOTE — MAU Note (Signed)
Tammy Rojas is a 38 y.o. at 108w2d here in MAU reporting: having a lot of pain in her stomach and lower back, cramping.  Has not had any bleeding.  Onset of complaint: started yesterday evening.  Was here a couple wks ago with pain, was told the baby was very small, hard to hear or see heart beat.  Had [place] yesterday, had not received a call about the [place] report.  Has had many pregnancies with problems, was told if something wasn't right to just come here.  Pain score: 5-6 Vitals:   01/03/24 1821  BP: 127/77  Pulse: 70  Resp: 16  Temp: 98.2 F (36.8 C)  SpO2: 99%      Lab orders placed from triage:

## 2024-01-03 NOTE — Discharge Instructions (Signed)
Regresar a MAU:  Si tiene un sangrado ms intenso que empapa ms de 2 toallas sanitarias por hora durante una hora o ms  Si sangra tanto que siente que podra desmayarse o se desmaya  Si tiene dolor abdominal significativo que no mejora con Tylenol 1000 mg cada 8 horas segn sea necesario para Chief Technology Officer.  Si presenta fiebre > 100.5

## 2024-01-03 NOTE — MAU Provider Note (Signed)
History     CSN: [id number]  Arrival date and time: 01/03/24 1753   Event Date/Time   First Provider Initiated Contact with Patient 01/03/24 1836      Chief Complaint  Patient presents with   Abdominal Pain   Miscarriage   HPI [name] is a 38 y.o. year old [phone] female at [redacted]w[redacted]d weeks gestation who presents to MAU reporting here with a lot of abdominal pain in her stomach and lower back that she describes as a cramping that started last night; rating 5-6/10.  She denies vaginal bleeding.  She reports nausea for few days.  She has a history of bleeding send in MAU on 12/19/2023 where she had an intrauterine gestational sac that was 11 weeks and 5 days by LMP but measured 6 weeks and 2 days with fetal bradycardia subchorionic hematoma and uterine fibroids.  She had a repeat ultrasound on yesterday 01/02/2024 with no appropriate growth noted the fetus continued to measure 6 weeks and 2 days and continued to have fetal bradycardia.  The patient states that no provider spoke to her about the results and she was worried and came to MAU to find out what happened because of her history of many pregnancy problems she is worried.  This history was obtained using the [employer] in person Spanish interpreter Byrd Hesselbach at [phone].   OB History     Gravida  6   Para  3   Term  3   Preterm  0   AB  2   Living  3      SAB  1   IAB  0   Ectopic  1   Multiple  0   Live Births  3        Obstetric Comments  2012 breech         Past Medical History:  Diagnosis Date   Fibroid    on [place] Feb 2025   No pertinent past medical history     Past Surgical History:  Procedure Laterality Date   APPENDECTOMY  2020   CESAREAN SECTION     LAPAROSCOPIC APPENDECTOMY N/A 06/07/2018   Procedure: APPENDECTOMY LAPAROSCOPIC;  Surgeon: Jimmye Norman, MD;  Location: [facility] OR;  Service: General;  Laterality: N/A;   LAPAROSCOPIC UNILATERAL SALPINGECTOMY N/A 01/07/2023   Procedure:  LAPAROSCOPIC RIGHT SALPINGECTOMY FOR ECTOPIC PREGNANCY;  Surgeon: Catalina Antigua, MD;  Location: [facility] OR;  Service: Gynecology;  Laterality: N/A;    Family History  Problem Relation Age of Onset   Cancer Mother        was young, does not know what kind   Diabetes Father     Social History   Tobacco Use   Smoking status: Never   Smokeless tobacco: Never  Vaping Use   Vaping status: Never Used  Substance Use Topics   Alcohol use: Not Currently    Comment: occ   Drug use: No    Allergies: No Known Allergies  Medications Prior to Admission  Medication Sig Dispense Refill Last Dose/Taking   Prenatal Vit-Fe Fumarate-FA (PRENATAL VITAMINS PO) Take by mouth.   01/02/2024    Review of Systems  Constitutional: Negative.   HENT: Negative.    Eyes: Negative.   Respiratory: Negative.    Cardiovascular: Negative.   Gastrointestinal: Negative.   Endocrine: Negative.   Genitourinary:  Positive for pelvic pain.  Musculoskeletal:  Positive for back pain.  Skin: Negative.   Allergic/Immunologic: Negative.   Neurological: Negative.   Hematological: Negative.  Psychiatric/Behavioral: Negative.     Physical Exam   Blood pressure 127/77, pulse 70, temperature 98.2 F (36.8 C), temperature source Oral, resp. rate 16, height 4\' 10"  (1.473 m), weight 68.1 kg, last menstrual period 10/13/2023, SpO2 99%, unknown if currently breastfeeding.  Physical Exam Vitals and nursing note reviewed.  Constitutional:      Appearance: Normal appearance. She is obese.  Cardiovascular:     Rate and Rhythm: Normal rate.  Pulmonary:     Effort: Pulmonary effort is normal.  Musculoskeletal:        General: Normal range of motion.  Skin:    General: Skin is warm and dry.  Neurological:     Mental Status: She is alert and oriented to person, place, and time.  Psychiatric:        Mood and Affect: Mood normal.        Behavior: Behavior normal.        Thought Content: Thought content normal.         Judgment: Judgment normal.    MAU Course  Procedures  MDM **Review of U/S from 01/02/2024**  US OB Transvaginal Result Date: 01/02/2024 : PROCEDURE: TRANSVAGINAL OB ULTRASOUND HISTORY: Patient is a 38 y/o F with Fetal Bradycardia, Pregnancy Viability. LMP 10/12/2023. G3O7. EDD 08/12/2024 by prior ultrasound. Beta HCG=45,[id number] on 12/19/2023. COMPARISON: U/S Ob 12/19/2023. TECHNIQUE: Two-dimensional transabdominal grayscale ultrasound imaging of the pelvis was performed with color Doppler evaluation. Transvaginal ultrasound was also performed. FINDINGS: The uterus measures 10.0 x 8.2 x 5.8 cm and demonstrates a normal homogeneous echotexture. There is a fundal, intramural fibroid measuring 2.9 x 2.7 cm. The cervical os is closed. The right ovary measures 3.6 x 1.8 cm and demonstrates a normal homogeneous echotexture. There is normal color Doppler flow. There is a complex tubular structure identified adjacent to the right ovary. This may represent a thrombus varicosity versus hematosalpinx versus pyosalpinx. The left ovary measures 3.1 x 1.8 x 1.8 cm and demonstrates a corpus luteal cyst. There is normal color Doppler flow. There is no fluid within the cul-de-sac. There is a single intrauterine gestation identified with a crown-rump length measurement of 0.6 cm, correlating to a gestational age of [redacted] weeks 2 day(s) (+/- 4 day(s). A yolk sac is visualized. It is difficult to obtain a fetal heart rate. Bradycardia is identified on the provided cine clips. There is a subchorionic hemorrhage visualized measuring 3.0 x 1.5 cm. IMPRESSION: 1. Single intrauterine gestation 11 weeks, 5 day(s) by LMP. Today's ultrasound measurements correlate with a gestational age of [redacted] weeks 2 day(s) (+/- 4 day(s). It is difficult to obtain a fetal heart rate. Bradycardia is noted on the provided cine clips. There has not been appropriate fetal growth since the prior ultrasound dating back to December 19, 2023. This may represent early  pregnancy failure. 2.  Subchorionic hemorrhage. 3.  Fundal fibroid. Thank you for allowing [place] to assist in the care of this patient. Electronically Signed   By: Lestine Box M.D.   On: 01/02/2024 21:49    *Consult with Dr. Charlotta Newton @ 1922 - notified of patient's complaints, assessments & U/S results, tx plan repeat U/S in 7-10 days - ok to d/c home, agrees with plan   - Discussed at length the results from her U/S done on 01/02/2024 and the recommendation from both Dr. Charlotta Newton  Assessment and Plan  1. Fetal bradycardia, antepartum condition or complication (Primary) - US OB Transvaginal; Future in 7-10 days  2. Abdominal cramping affecting pregnancy, antepartum -  Advised to take Ibuprofen 600 mg po every 6 hrs prn - Return to MAU for pain not relieved by Ibuprofen  3. Pregnancy with uncertain fetal viability, single or unspecified fetus - US OB Transvaginal; Future  4. Threatened miscarriage in early pregnancy - Information provided on threatened miscarriage - Advised that outcome of pregnancy does not seem to be a good one  5. [redacted] weeks gestation of pregnancy   - Discharge patient - Information provided on uterine fibroids - Someone will get U/S scheduled for 7-10 days - Patient verbalized an understanding of the plan of care and agrees.    Raelyn Mora, CNM 01/03/2024, 6:36 PM

## 2024-01-06 ENCOUNTER — Other Ambulatory Visit: Payer: Self-pay

## 2024-01-07 ENCOUNTER — Telehealth: Payer: Self-pay | Admitting: Family Medicine

## 2024-01-07 ENCOUNTER — Ambulatory Visit (HOSPITAL_COMMUNITY)
Admission: RE | Admit: 2024-01-07 | Discharge: 2024-01-07 | Disposition: A | Discharge status: Home / Self Care | Payer: Medicaid Other | Source: Ambulatory Visit | Attending: Obstetrics and Gynecology | Admitting: Obstetrics and Gynecology

## 2024-01-07 ENCOUNTER — Telehealth: Payer: Self-pay

## 2024-01-07 DIAGNOSIS — O368390 Maternal care for abnormalities of the fetal heart rate or rhythm, unspecified trimester, not applicable or unspecified: Secondary | ICD-10-CM | POA: Diagnosis present

## 2024-01-07 DIAGNOSIS — O3680X0 Pregnancy with inconclusive fetal viability, not applicable or unspecified: Secondary | ICD-10-CM | POA: Diagnosis present

## 2024-01-07 NOTE — Telephone Encounter (Addendum)
-----   Message from Cherre Robins sent at 01/03/2024  5:13 AM EST ----- Regarding: Ultrasound Patient needs repeat US in one week with provider/MD appt to discuss results.  She is having inappropriate interval growth, but no loss as fetal bradycardia continues to be noted.   Thanks,  Shanda Bumps, CNM ----- Message ----- From: Interface, Rad Results In Sent: 01/02/2024   9:52 PM EST To: Gerrit Heck, CNM  Called pt with Spanish Interpreter Gershon Cull and pt informed me that she had her [place] today and needed an appt with a provider for results.  Spoke with Dr. Crissie Reese who stated that he would call the pt with results tomorrow.  Pt notified that provider will call her tomorrow.  Pt verbalized understanding with no further questions.   Leonette Nutting

## 2024-01-07 NOTE — Telephone Encounter (Signed)
Urgent Results from [facility] Radiology: Dr Jeanmarie Plant No Cardiac activity, meets criteria for failed pregnancy  Federico Flake, MD, MPH, ABFM, [facility], [place] Attending Physician Center for [facility]   6:14 PM called patient and discussed results. Reviewed options for expectant management, cytotec and D&E. Patient would like to talk to her husband about the option.   Reviewed process for cytotec in detail.  Reviewed that RN will call tomorrow to help review her decision and can help facilitate medication vs D&E.  I am in clinic at [facility] tomorrow and can be consulted at anytime.

## 2024-01-08 NOTE — Telephone Encounter (Signed)
Per review of chart patient already contacted by Dr. Alvester Morin regarding results and plan for management.

## 2024-01-09 ENCOUNTER — Ambulatory Visit (HOSPITAL_COMMUNITY): Payer: Medicaid Other

## 2024-01-09 ENCOUNTER — Encounter: Payer: Medicaid Other | Admitting: Family Medicine

## 2024-01-13 ENCOUNTER — Inpatient Hospital Stay (HOSPITAL_COMMUNITY)
Admission: AD | Admit: 2024-01-13 | Discharge: 2024-01-13 | Disposition: A | Discharge status: Home / Self Care | Payer: Medicaid Other | Attending: Obstetrics and Gynecology | Admitting: Obstetrics and Gynecology

## 2024-01-13 DIAGNOSIS — R109 Unspecified abdominal pain: Secondary | ICD-10-CM | POA: Diagnosis present

## 2024-01-13 DIAGNOSIS — Z3A09 9 weeks gestation of pregnancy: Secondary | ICD-10-CM

## 2024-01-13 DIAGNOSIS — M545 Low back pain, unspecified: Secondary | ICD-10-CM

## 2024-01-13 DIAGNOSIS — R1030 Lower abdominal pain, unspecified: Secondary | ICD-10-CM | POA: Diagnosis not present

## 2024-01-13 DIAGNOSIS — O021 Missed abortion: Secondary | ICD-10-CM | POA: Diagnosis not present

## 2024-01-13 DIAGNOSIS — R519 Headache, unspecified: Secondary | ICD-10-CM | POA: Insufficient documentation

## 2024-01-13 DIAGNOSIS — O26891 Other specified pregnancy related conditions, first trimester: Secondary | ICD-10-CM | POA: Diagnosis present

## 2024-01-13 LAB — URINALYSIS, ROUTINE W REFLEX MICROSCOPIC
Bilirubin Urine: NEGATIVE
Glucose, UA: NEGATIVE mg/dL
Ketones, ur: NEGATIVE mg/dL
Leukocytes,Ua: NEGATIVE
Nitrite: NEGATIVE
Protein, ur: NEGATIVE mg/dL
Specific Gravity, Urine: 1.025 (ref 1.005–1.030)
pH: 5 (ref 5.0–8.0)

## 2024-01-13 MED ORDER — PROMETHAZINE HCL 12.5 MG PO TABS: 12.5000 mg | ORAL_TABLET | ORAL | 0 refills | Status: AC | PRN

## 2024-01-13 MED ORDER — IBUPROFEN 800 MG PO TABS: 800.0000 mg | ORAL_TABLET | Freq: Three times a day (TID) | ORAL | 0 refills | Status: AC

## 2024-01-13 MED ORDER — MISOPROSTOL 200 MCG PO TABS: ORAL_TABLET | ORAL | 1 refills | Status: AC

## 2024-01-13 MED ORDER — ACETAMINOPHEN-CODEINE 300-30 MG PO TABS: 1.0000 | ORAL_TABLET | Freq: Four times a day (QID) | ORAL | 0 refills | Status: AC | PRN

## 2024-01-13 NOTE — MAU Note (Signed)
.  Tammy Rojas is a 38 y.o. at [redacted]w[redacted]d here in MAU reporting: She reports she was told she was having a miscarriage on 2/18. She reports she was told she could choose medications or a surgery. She reports she is here to see what the best option would be. Denies VB. Reports occasional lower abdominal cramping, lower back pain, and HA's that are relieved with Tylenol.   Last dose of Tylenol this morning around 1000. Ate right before coming to MAU.  Onset of complaint: 2/18 Pain score: 4/10 HA - denies current abdominal cramping  Vitals:   01/13/24 1805  BP: 125/81  Pulse: 88  Resp: 16  Temp: 98.2 F (36.8 C)  SpO2: 100%      FHT: n/a Lab orders placed from triage: UA

## 2024-01-13 NOTE — MAU Provider Note (Signed)
 None     S [name] is a 38 y.o. [phone] pregnant female at [redacted]w[redacted]d who presents to MAU today with complaint of MAB previously seen in MAU with previous desire for expectant management. Desires medication today. Reports mild abdominal cramping, no vaginal bleeding. She reports cramping and HA that goes away with Tylenol.   Receives care at [facility]. Prenatal records reviewed.  Pertinent items noted in HPI and remainder of comprehensive ROS otherwise negative.   O BP 125/81 (BP Location: Right Arm)   Pulse 88   Temp 98.2 F (36.8 C) (Oral)   Resp 16   Ht 4\' 10"  (1.473 m)   Wt 67.8 kg   LMP 10/13/2023   SpO2 100%   BMI 31.22 kg/m  Physical Exam Vitals reviewed.  Constitutional:      Appearance: Normal appearance.  HENT:     Head: Normocephalic.  Cardiovascular:     Rate and Rhythm: Normal rate and regular rhythm.     Pulses: Normal pulses.     Heart sounds: Normal heart sounds.  Pulmonary:     Effort: Pulmonary effort is normal.     Breath sounds: Normal breath sounds.  Skin:    General: Skin is warm and dry.     Capillary Refill: Capillary refill takes less than 2 seconds.  Neurological:     Mental Status: She is alert and oriented to person, place, and time.  Psychiatric:        Mood and Affect: Mood normal.        Speech: Speech normal.        Behavior: Behavior normal.      MDM: Reviewed previous MAU labs and visits. Patient desires active management of MAB.   MAU Course:  A Missed abortion with fetal demise before 20 completed weeks of gestation - Plan: Discharge patient  Acute low back pain, unspecified back pain laterality, unspecified whether sciatica present - Plan: Discharge patient  Nonintractable headache, unspecified chronicity pattern, unspecified headache type - Plan: Discharge patient  Abdominal cramping - Plan: Discharge patient  Medical screening exam complete  P Discharge from MAU in stable condition with bleeding  precautions Follow up at [facility] as scheduled for ongoing care Will follow the IUP Failure protocol as noted below with all medications sent to verified preferred pharmacy.   Early Intrauterine Pregnancy Failure Protocol X  Documented intrauterine pregnancy failure less than or equal to [redacted] weeks   gestation  X  No serious current illness  X  Baseline Hgb greater than or equal to 10g/dl  X  Patient has easily accessible transportation to the hospital  X  Clear preference  X  Practitioner/physician deems patient reliable  X  Counseling by practitioner or physician  X  Patient education by RN  X  Consent form signed       Rho-Gam given by RN if indicated  X  Medication dispensed  X  Cytotec 800 mcg PO  by patient at home with repeat dose in 48 hours  X   Ibuprofen 600 mg 1 tablet by mouth every 6 hours as needed #30 - prescribed  X   Tylenol #3 mg by mouth every 4 to 6 hours as needed - prescribed  X   Phenergan 12.5 mg by mouth every 4 hours as needed for nausea - prescribed  Reviewed with pt cytotec procedure.  Pt verbalizes that she lives close to the hospital and has transportation readily available.  Pt appears reliable and verbalizes  understanding and agrees with plan of care  Allergies as of 01/13/2024   No Known Allergies      Medication List     TAKE these medications    acetaminophen-codeine 300-30 MG tablet Commonly known as: TYLENOL #3 Take 1-2 tablets by mouth every 6 (six) hours as needed for moderate pain (pain score 4-6).   ibuprofen 800 MG tablet Commonly known as: ADVIL Take 1 tablet (800 mg total) by mouth 3 (three) times daily.   misoprostol 200 MCG tablet Commonly known as: CYTOTEC Place four tablets in between your gums and cheeks (two tablets on each side). Repeat in 48 hours.   PRENATAL VITAMINS PO Take by mouth.   promethazine 12.5 MG tablet Commonly known as: PHENERGAN Take 1 tablet (12.5 mg total) by mouth every 4 (four) hours as needed for nausea or  vomiting.       - In person Spanish interpreter used for entire episode of care.   Lamont Snowball, MSN, CNM 01/13/2024 8:19 PM  Certified Nurse Midwife, [facility]

## 2024-01-13 NOTE — Telephone Encounter (Signed)
 Called pt with Spanish interpreter Marlen.   Reports the following symptoms intermittently since Friday, 01/10/24: -pelvic pain -back pain -headache -nausea  O Pos (06/26/22) Hemoglobin 13 (12/19/23)  Denies vaginal bleeding during entire pregnancy. Desires D&E only if BTL can be done at same time. Patient has Medicaid insurance, no consent signed. Per Donavan Foil, MD would not recommend completing BTL at same time. Patient would prefer medication management. Reviewed with Leanora Cover, MD who recommends patient be seen at MAU for rule out of infection related to missed AB prior to giving any medication. Pt intends to present to MAU prior to 7 PM when her husband returns home from work. Immediate return precautions reviewed with patient including heavy vaginal bleeding, worsening pain, or fever.

## 2024-01-16 ENCOUNTER — Other Ambulatory Visit: Payer: Self-pay

## 2024-01-22 ENCOUNTER — Other Ambulatory Visit: Payer: Self-pay

## 2024-01-22 ENCOUNTER — Ambulatory Visit: Payer: Medicaid Other | Admitting: Family Medicine

## 2024-01-22 ENCOUNTER — Other Ambulatory Visit: Payer: Medicaid Other

## 2024-01-22 VITALS — BP 128/88 | HR 86 | Wt 147.0 lb

## 2024-01-22 DIAGNOSIS — O021 Missed abortion: Secondary | ICD-10-CM | POA: Diagnosis not present

## 2024-01-22 DIAGNOSIS — Z30016 Encounter for initial prescription of transdermal patch hormonal contraceptive device: Secondary | ICD-10-CM

## 2024-01-22 DIAGNOSIS — Z3A00 Weeks of gestation of pregnancy not specified: Secondary | ICD-10-CM

## 2024-01-22 DIAGNOSIS — Z3A08 8 weeks gestation of pregnancy: Secondary | ICD-10-CM

## 2024-01-22 MED ORDER — ESTRADIOL-LEVONORGESTREL 0.045-0.015 MG/DAY TD PTWK: 1.0000 | MEDICATED_PATCH | TRANSDERMAL | 12 refills | Status: DC

## 2024-01-22 MED ORDER — TWIRLA 120-30 MCG/24HR TD PTWK: 1.0000 | MEDICATED_PATCH | TRANSDERMAL | 3 refills | Status: DC

## 2024-01-23 DIAGNOSIS — Z30016 Encounter for initial prescription of transdermal patch hormonal contraceptive device: Secondary | ICD-10-CM | POA: Insufficient documentation

## 2024-01-23 DIAGNOSIS — O021 Missed abortion: Secondary | ICD-10-CM | POA: Insufficient documentation

## 2024-01-23 NOTE — Assessment & Plan Note (Signed)
 Discussed at length birth control options.  Patient has had Nexplanon in the past and did not like the weight gain or side effects from the Nexplanon.  She does not want to take a pill every day.  She is here to the patch and is interested in using this.  No history of blood clots or cancers.  Prescription sent for Tory Emerald to patient's pharmacy.

## 2024-01-23 NOTE — Progress Notes (Signed)
    Subjective:  Tammy Rojas is a 38 y.o. female who presents to the clinic today for SAB follow-up, ultrasound results, contraceptive management  HPI:  SAB follow-up  discussion on ultrasound results Contraceptive management Patient presented to the MAU with vaginal bleeding in pregnancy on 2/14.  Fetus had fetal bradycardia but still had a heartbeat.  She was scheduled for follow-up viability scan in 2 weeks.  She returned on 2/24 with worsening vaginal bleeding and missed abortion.  Scheduled for SAB follow-up at that MAU visit.  Patient was given Cytotec and pain meds during her MAU visit on 2/24.  Patient had an ultrasound this morning with some signs of vascularity but patient reports she is having no abdominal pain, fever, bleeding is extremely light and she has no concerns regarding the bleeding at this time.  Patient is interested in starting some form of birth control at this time.  As he was Nexplanon in the past and did not like it.  Has also used OCPs.  Is thinking about possibly the patch.  She has no other concerns at this time.  Objective:  Physical Exam: BP 128/88 (BP Location: Left Arm, Patient Position: Sitting, Cuff Size: Normal)   Pulse 86   Wt 147 lb (66.7 kg)   LMP 10/13/2023   SpO2 98%   BMI 30.72 kg/m   Gen: Well-appearing, NAD, sitting comfortably CV: Regular rate Pulm: Normal work of breathing, speaking in full sentences GI:  Soft, Nontender, Nondistended. MSK: no edema, cyanosis Skin: warm, dry Neuro: grossly normal, moves all extremities Psych: Normal affect and thought content  No results found for this or any previous visit (from the past 72 hours).   Assessment/Plan:  Missed abortion with fetal demise before 20 completed weeks of gestation Patient is doing well at this time.  Minimal bleeding and no pain, abnormal discharge, fevers.  Given no symptoms do not feel patient needs further doses of Cytotec at this time.  Discussed strict MAU and  return precautions here.  Patient had no further questions or concerns regarding this.  Encounter for initial prescription of transdermal patch hormonal contraceptive device Discussed at length birth control options.  Patient has had Nexplanon in the past and did not like the weight gain or side effects from the Nexplanon.  She does not want to take a pill every day.  She is here to the patch and is interested in using this.  No history of blood clots or cancers.  Prescription sent for Tory Emerald to patient's pharmacy.   Lab Orders  No laboratory test(s) ordered today    Meds ordered this encounter  Medications   DISCONTD: estradiol-levonorgestrel (CLIMARAPRO) 0.045-0.015 MG/DAY    Sig: Place 1 patch onto the skin once a week.    Dispense:  4 patch    Refill:  12   Levonorgestrel-Eth Estradiol (TWIRLA) 120-30 MCG/24HR PTWK    Sig: Place 1 patch onto the skin once a week.    Dispense:  12 patch    Refill:  3      Derrel Nip, MD Attending Family Medicine Physician, [facility] for [facility], [facility]   01/23/24 9:12 AM

## 2024-01-23 NOTE — Assessment & Plan Note (Signed)
 Patient is doing well at this time.  Minimal bleeding and no pain, abnormal discharge, fevers.  Given no symptoms do not feel patient needs further doses of Cytotec at this time.  Discussed strict MAU and return precautions here.  Patient had no further questions or concerns regarding this.

## 2024-05-27 ENCOUNTER — Other Ambulatory Visit: Payer: Self-pay | Admitting: Infectious Diseases

## 2024-05-27 ENCOUNTER — Ambulatory Visit
Admission: RE | Admit: 2024-05-27 | Discharge: 2024-05-27 | Disposition: A | Discharge status: Home / Self Care | Source: Ambulatory Visit | Attending: Infectious Diseases | Admitting: Infectious Diseases

## 2024-05-27 DIAGNOSIS — R7612 Nonspecific reaction to cell mediated immunity measurement of gamma interferon antigen response without active tuberculosis: Secondary | ICD-10-CM

## 2024-09-10 ENCOUNTER — Encounter: Payer: Self-pay | Admitting: Dermatology

## 2024-09-10 ENCOUNTER — Ambulatory Visit (INDEPENDENT_AMBULATORY_CARE_PROVIDER_SITE_OTHER): Admitting: Dermatology

## 2024-09-10 VITALS — BP 142/65 | HR 95

## 2024-09-10 DIAGNOSIS — L723 Sebaceous cyst: Secondary | ICD-10-CM

## 2024-09-10 DIAGNOSIS — L810 Postinflammatory hyperpigmentation: Secondary | ICD-10-CM

## 2024-09-10 DIAGNOSIS — L720 Epidermal cyst: Secondary | ICD-10-CM | POA: Diagnosis not present

## 2024-09-10 DIAGNOSIS — L811 Chloasma: Secondary | ICD-10-CM

## 2024-09-10 MED ORDER — SAFETY SEAL MISCELLANEOUS MISC: 1.0000 | Freq: Every day | 6 refills | Status: AC

## 2024-09-10 NOTE — Patient Instructions (Addendum)
 RESUMEN DE LA VISITA:  Durante la visita de hoy, abordamos sus inquietudes [employer] quistes y las manchas hiperpigmentadas en sus brazos. Examinamos los quistes en la parte lateral derecha del cuello y la parte superior de la espalda, as como las manchas oscuras en brazos y [employer]. Le proporcionamos un plan de tratamiento para abordar estos problemas y analizamos las opciones para su posterior manejo.  SU PLAN:  -QUISTES EPIDRMICOS EN LA PARTE LATERAL DERECHA DEL CUELLO Y LA PARTE SUPERIOR DE LA ESPALDA: Los quistes epidrmicos son bultos no cancerosos debajo de la piel causados por la obstruccin de las glndulas sebceas. Sus quistes son estables y no duelen, pero si se vuelven molestos, la extirpacin barbados es una opcin. Esto reemplazara el quiste con una cicatriz. Por favor, contctenos si decide someterse a una extirpacin barbados para que lo derivemos a un cirujano.  -MELASMA DE BRAZOS BILATERALES: El melasma es una afeccin que causa manchas oscuras en la piel, a menudo debido a cambios hormonales y a la mnima exposicin al sol. Le recetamos una crema compuesta con cido transamnico, vitamina C, resveratrol y niacinamida, y le proporcionamos ignacia jubilee del srum dual Radiantone. Aplique una cantidad del tamao de un guisante de cada crema, mzclela y aplquela en las zonas afectadas por la maana y por la noche. Contine usando protector solar con FPS 30 o superior a diario, incluso en das nublados o en interiores. Tambin le proporcionamos muestras de varios protectores solares para que los pruebe.  INSTRUCCIONES:  Si decide someterse a una extirpacin barbados de los quistes, contctenos. Contine usando las cremas y Therapist, sports solar recetados segn las indicaciones. Si tiene alguna pregunta o inquietud, no dude en contactarnos.     Important Information  Due to recent changes in healthcare laws, you may see results of your pathology and/or laboratory studies on  MyChart before the doctors have had a chance to review them. We understand that in some cases there may be results that are confusing or concerning to you. Please understand that not all results are received at the same time and often the doctors may need to interpret multiple results in order to provide you with the best plan of care or course of treatment. Therefore, we ask that you please give us  2 business days to thoroughly review all your results before contacting the office for clarification. Should we see a critical lab result, you will be contacted sooner.   If You Need Anything After Your Visit  If you have any questions or concerns for your doctor, please call our main line at [phone] If no one answers, please leave a voicemail as directed and we will return your call as soon as possible. Messages left after 4 pm will be answered the following business day.   You may also send us  a message via MyChart. We typically respond to MyChart messages within 1-2 business days.  For prescription refills, please ask your pharmacy to contact our office. Our fax number is [phone].  If you have an urgent issue when the clinic is closed that cannot wait until the next business day, you can page your doctor at the number below.    Please note that while we do our best to be available for urgent issues outside of office hours, we are not available 24/7.   If you have an urgent issue and are unable to reach us , you may choose to seek medical care at your doctor's office, retail clinic, urgent care center, or  emergency room.  If you have a medical emergency, please immediately call 911 or go to the emergency department. In the event of inclement weather, please call our main line at [phone] for an update on the status of any delays or closures.  Dermatology Medication Tips: Please keep the boxes that topical medications come in in order to help keep track of the instructions about where  and how to use these. Pharmacies typically print the medication instructions only on the boxes and not directly on the medication tubes.   If your medication is too expensive, please contact our office at [phone] or send us  a message through MyChart.   We are unable to tell what your co-pay for medications will be in advance as this is different depending on your insurance coverage. However, we may be able to find a substitute medication at lower cost or fill out paperwork to get insurance to cover a needed medication.   If a prior authorization is required to get your medication covered by your insurance company, please allow us  1-2 business days to complete this process.  Drug prices often vary depending on where the prescription is filled and some pharmacies may offer cheaper prices.  The website www.goodrx.com contains coupons for medications through different pharmacies. The prices here do not account for what the cost may be with help from insurance (it may be cheaper with your insurance), but the website can give you the price if you did not use any insurance.  - You can print the associated coupon and take it with your prescription to the pharmacy.  - You may also stop by our office during regular business hours and pick up a GoodRx coupon card.  - If you need your prescription sent electronically to a different pharmacy, notify our office through [facility] or by phone at [phone]

## 2024-09-10 NOTE — Progress Notes (Unsigned)
 New Patient Visit   Subjective  Tammy Rojas is a 38 y.o. female who presents for the following: Cysts  Patient accompanied by Interpreter Wallis Hussar  Patient states she has cysts located at the back of neck that she would like to have examined.  Patient reports the areas have been there for 4 years She reports the areas are not bothersome. Patient reports she has not previously been treated for these areas.  Patient denies Hx of bx. Patient denies family history of skin cancer(s).  Patient would also like to discuss discoloration on bilateral arms, neck and back Patient reports using sunscreen but is does not seem to help  The patient has spots, moles and lesions to be evaluated, some may be new or changing and the patient may have concern these could be cancer.   The following portions of the chart were reviewed this encounter and updated as appropriate: medications, allergies, medical history  Review of Systems:  No other skin or systemic complaints except as noted in HPI or Assessment and Plan.  Objective  Well appearing patient in no apparent distress; mood and affect are within normal limits.  A focused examination was performed of the following areas: Neck, Back and Bilateral Arms  Relevant exam findings are noted in the Assessment and Plan.                  Assessment & Plan   Cyst Exam: Subcutaneous Sebaceous nodule.  Benign-appearing. Exam most consistent with a pilar cyst. Discussed that a cyst is a benign growth that can grow over time and sometimes get irritated or inflamed. Recommend observation if it is not bothersome. Discussed option of surgical excision to remove it if it is growing, symptomatic, or other changes noted. Please call for new or changing lesions so they can be evaluated.  1 cm non tender cystic nodule on right lateral neck 3.5 cm non tender cystic nodule on midline upper back  SEBACEOUS CYST (2) Neck - Posterior, upper back  midline MELASMA   Related Medications Safety Seal Miscellaneous MISC 1 Application by Does not apply route daily. Medication name, Melaxemic Cream (Tranexamic Acid USP 5%, Kojic Acid USP 2%, Vit C USP 2.5%, Tretinoin USP 0.025%, Hydrocortisone USP 1%, Hyaluronic Acid EXCP 0.1%)  MELASMA Exam: reticulated hyperpigmented patches on bilateral arms  Melasma is a chronic; persistent condition of hyperpigmented patches generally on the face, worse in summer due to higher UV exposure.    Heredity; thyroid disease; sun exposure; pregnancy; birth control pills; epilepsy medication and darker skin may predispose to Melasma.   Recommendations include: - Sun avoidance and daily broad spectrum (UVA/UVB) tinted mineral sunscreen SPF 30+, with Zinc or Titanium Dioxide. - Rx topical bleaching creams (i.e. hydroquinone) is a common treatment but should not be used long term.  Hydroquinones may be mixed with retinoids; vitamin C; steroids; Kojic Acid. - Alastin A-luminate, retinoids, vitamin C, topical tranexamic acid, glycolic acid and kojic acid can be used for brightening while on break from hydroquinone - Rx Azelaic Acid is also a treatment option that is safe for pregnancy (Category B). - OTC Heliocare can be helpful in control and prevention. - Oral Rx with Tranexamic Acid 250 mg - 650 mg po daily can be used for moderate to severe cases especially during summer (contraindications include pregnancy; lactation; hx of PE; hx of DVT; clotting disorder; heart disease; anticoagulant use and upcoming long trips)   - Chemical peels (would need multiple for best result).  -  Lasers and  Microdermabrasion may also be helpful adjunct treatments.  Treatment Plan: Prescribed Melaxemic cream from [employer] (Tranexamic Acid USP 5%, Kojic Acid USP 2%, Vit C USP 2.5%, Tretinoin USP 0.025%, Hydrocortisone USP 1%, Hyaluronic Acid EXCP 0.1%) Recommended Eucerin Radiant Tone Recommended Sunscreen of SPF 30 or  higher   Recommend daily broad spectrum (UVA/UVB) tinted mineral sunscreen SPF 30+, with Zinc or Titanium Dioxide.    Return in about 6 months (around 03/11/2025) for Melasma F/U.  I, Lyle Cords, as acting as a Neurosurgeon for [employer], DO .   Documentation: I have reviewed the above documentation for accuracy and completeness, and I agree with the above.  Delon Lenis, DO

## 2024-10-12 ENCOUNTER — Encounter: Payer: Self-pay | Admitting: Family Medicine

## 2024-10-12 ENCOUNTER — Other Ambulatory Visit: Payer: Self-pay

## 2024-10-12 ENCOUNTER — Ambulatory Visit (INDEPENDENT_AMBULATORY_CARE_PROVIDER_SITE_OTHER): Admitting: Family Medicine

## 2024-10-12 VITALS — BP 123/85 | HR 79 | Wt 142.0 lb

## 2024-10-12 DIAGNOSIS — Z3009 Encounter for other general counseling and advice on contraception: Secondary | ICD-10-CM | POA: Insufficient documentation

## 2024-10-12 DIAGNOSIS — R1024 Suprapubic pain: Secondary | ICD-10-CM | POA: Insufficient documentation

## 2024-10-12 LAB — POCT PREGNANCY, URINE: Preg Test, Ur: NEGATIVE

## 2024-10-12 NOTE — Progress Notes (Unsigned)
    Subjective:  Tammy Rojas is a 38 y.o. female who presents to the clinic today for intermittent pelvic pain  HPI: Pelvic pain Patient presents for evaluation for intermittent pelvic pain.  Reports has been going on for 2 to 3 months.  Does not happen every day but will occur several times a week.  Denies any constipation.  Patient cannot identify any triggers for the abdominal pain or things that make it better.  Reports that it is a dull pain in her suprapubic area.  She has been having normal menses that last 4 days.  Last time this occurs was 2 to 3 days ago.  She is not having any abdominal pain today.    Unwanted fertility Patient reports that she is done having children.  She and her husband were discussing future pregnancies and decided that she would like to have a tubal ligation.  Patient was using patch but stopped using it several months ago because it was irritating her skin.  Is not interested in pills or other forms of birth control.  Patient has Medicaid.    Objective:  Physical Exam: BP 123/85   Pulse 79   Wt 142 lb (64.4 kg)   LMP 09/20/2024 (Within Days)   BMI 29.68 kg/m   Gen: Alert, well-appearing, no acute distress CV: Rate and rhythm, no murmurs appreciated Pulm: Normal work of breathing, speaking in full sentences GI: Soft, nontender, normal bowel sounds, no organomegaly MSK: no edema, cyanosis, or clubbing noted Skin: warm, dry Neuro: grossly normal, moves all extremities Psych: Normal affect and thought content  Results for orders placed or performed in visit on 10/12/24 (from the past 72 hours)  Pregnancy, urine POC     Status: None   Collection Time: 10/12/24  2:15 PM  Result Value Ref Range   Preg Test, Ur NEGATIVE NEGATIVE    Comment:        THE SENSITIVITY OF THIS METHODOLOGY IS >20 mIU/mL.      Assessment/Plan:  Suprapubic pain Unclear etiology.  Patient reports normal bowel movements so not clearly related to constipation but this  could be a trigger.  Will get pelvic ultrasound to assess for fibroids and she will follow-up with Dr Jeralyn.  Unwanted fertility Offered birth control today but patient will use condoms.  Patient would like tubal ligation.  Medicaid papers signed today.  She will follow-up with Gyn surgeon to further discuss procedure and have it scheduled.   Lab Orders         Pregnancy, urine POC     No orders of the defined types were placed in this encounter.     Steffan Rover, MD Attending Family Medicine Physician, [facility] for [facility], [facility]   10/14/24 9:32 AM

## 2024-10-12 NOTE — Progress Notes (Unsigned)
 BTL Sterilization Consent formed signed by patient in office. Form filed in the filing cabinet to be faxed in patient's chart.  Rosaline, RN 10/12/2024

## 2024-10-14 ENCOUNTER — Encounter: Payer: Self-pay | Admitting: *Deleted

## 2024-10-14 NOTE — Assessment & Plan Note (Signed)
 Unclear etiology.  Patient reports normal bowel movements so not clearly related to constipation but this could be a trigger.  Will get pelvic ultrasound to assess for fibroids and she will follow-up with Dr Jeralyn.

## 2024-10-14 NOTE — Assessment & Plan Note (Signed)
 Offered birth control today but patient will use condoms.  Patient would like tubal ligation.  Medicaid papers signed today.  She will follow-up with Gyn surgeon to further discuss procedure and have it scheduled.

## 2024-10-23 ENCOUNTER — Ambulatory Visit
Admission: RE | Admit: 2024-10-23 | Discharge: 2024-10-23 | Disposition: A | Source: Ambulatory Visit | Attending: Family Medicine

## 2024-10-23 DIAGNOSIS — R1024 Suprapubic pain: Secondary | ICD-10-CM

## 2024-12-01 ENCOUNTER — Other Ambulatory Visit: Payer: Self-pay

## 2024-12-01 ENCOUNTER — Ambulatory Visit: Admitting: Obstetrics and Gynecology

## 2024-12-01 ENCOUNTER — Encounter: Payer: Self-pay | Admitting: Obstetrics and Gynecology

## 2024-12-01 VITALS — BP 129/83 | HR 63 | Wt 145.0 lb

## 2024-12-01 DIAGNOSIS — Z3009 Encounter for other general counseling and advice on contraception: Secondary | ICD-10-CM | POA: Diagnosis not present

## 2024-12-01 DIAGNOSIS — Z758 Other problems related to medical facilities and other health care: Secondary | ICD-10-CM | POA: Diagnosis not present

## 2024-12-01 DIAGNOSIS — R1024 Suprapubic pain: Secondary | ICD-10-CM

## 2024-12-01 DIAGNOSIS — Z603 Acculturation difficulty: Secondary | ICD-10-CM

## 2024-12-01 NOTE — Progress Notes (Signed)
 "   GYNECOLOGY VISIT  Patient name: Tammy Rojas MRN [id number]  Date of birth: February 14, 1986 Chief Complaint:   Surgical Consult  History:  Tammy Rojas here to discuss tubal/sterilization surgery. Wants to wait at least 2 months, has a wedding coming up. Has had 3 losses and does not want to try any more.  Tubal consents expires Apr 10, 2025 , believes would be ok with beginning of May to schedule surgery. As long as it's not May 2 (has another event scheduled for that day) after that day is best.   Also has a history of pelvic pain. A little bit of pain that is not too severe. The pain has been improving and not as frequent and it is not as severe as before. The pain is described as 'colicky', sudden onset, cramping not quite like cramping during a period as this would occur outside of the period. With the period had never gotten cramps. Prior treatment for the pain: tylenol , that would help. Menses have been normal. Had been having some months where she may or may not have a period but have been pretty stable. Last month, cycle started on the 15th but has not gotten her menses as of yet this year. Has not had this issue beforehand. Previously would have a monthly cycle around the same time. It has been irregular for about 5-6 months. Had been on the contraceptive patch, tried for a month but stopped due to bleeding irregularity.  . No problems with taking tylenol , ibuprofen , or oxycodone     The following portions of the patient's history were reviewed and updated as appropriate: allergies, current medications, past family history, past medical history, past social history, past surgical history and problem list.   Health Maintenance:   Last pap No results found for: DIAGPAP, HPVHIGH, ADEQPAP  Health Maintenance  Topic Date Due   Hepatitis C Screening  Never done   Hepatitis B Vaccine (1 of 3 - 19+ 3-dose series) Never done   HPV Vaccine (1 - 3-dose SCDM series) Never done    Pap with HPV screening  Never done   DTaP/Tdap/Td vaccine (2 - Td or Tdap) 07/31/2022   Flu Shot  06/19/2024   COVID-19 Vaccine (1 - 2025-26 season) Never done   HIV Screening  Completed   Pneumococcal Vaccine  Aged Out   Meningitis B Vaccine  Aged Out      Review of Systems:  Pertinent items are noted in HPI. Comprehensive review of systems was otherwise negative.   Objective:  Physical Exam BP 129/83   Pulse 63   Wt 145 lb (65.8 kg)   LMP 11/02/2024 (Exact Date)   BMI 30.31 kg/m    Physical Exam Vitals and nursing note reviewed.  Constitutional:      Appearance: Normal appearance.  HENT:     Head: Normocephalic and atraumatic.  Pulmonary:     Effort: Pulmonary effort is normal.  Skin:    General: Skin is warm and dry.  Neurological:     General: No focal deficit present.     Mental Status: She is alert.  Psychiatric:        Mood and Affect: Mood normal.        Behavior: Behavior normal.        Thought Content: Thought content normal.        Judgment: Judgment normal.      Labs and Imaging Narrative & Impression CLINICAL DATA:  Pelvic and abdominal pain  EXAM: TRANSABDOMINAL AND TRANSVAGINAL ULTRASOUND OF PELVIS   TECHNIQUE: Both transabdominal and transvaginal ultrasound examinations of the pelvis were performed. Transabdominal technique was performed for global imaging of the pelvis including uterus, ovaries, adnexal regions, and pelvic cul-de-sac. It was necessary to proceed with endovaginal exam following the transabdominal exam to visualize the uterus and adnexae in better detail.   COMPARISON:  06/07/2018   FINDINGS: Uterus   Measurements: 8.0 x 4.5 x 5.1 cm = volume: 95 mL. No fibroids or other mass visualized.   Endometrium   Thickness: 16 mm.  No focal abnormality visualized.   Right ovary   Measurements: 3.0 x 3.0 x 2.8 cm = volume: 13 mL. Small anechoic right ovarian cyst measures 2.0 x 2.1 x 1.8 cm   Left ovary    Measurements: 2.0 x 1.1 x 2.3 cm = volume: 2.5 mL. Small dominant follicle or collapsing cyst measures 7 x 6 x 12 mm.   Other findings   No abnormal free fluid.   IMPRESSION: 1. Small bilateral ovarian cysts. No followup imaging recommended. Note: This recommendation does not apply to premenarchal patients or to those with increased risk (genetic, family history, elevated tumor markers or other high-risk factors) of ovarian cancer. Reference: Radiology 2019 Nov; 293(2):359-371. 2. No other acute finding by pelvic ultrasound.       Assessment & Plan:  1. Sterilization consult (Primary) 2. Unwanted fertility She desires permanent sterilization. Discussed alternatives including LARC options and vasectomy. She declines these options. Discussed surgery of salpingectomy vs tubal ligation. She would like to do a salpingectomy.  Risks of surgery include but are not limited to: bleeding, infection, injury to surrounding organs/tissues (i.e. bowel/bladder/ureters), need for additional procedures, wound complications, hospital re-admission, regret,  conversion to open surgery, and VTE.  Reviewed restrictions and recovery following surgery  Plan for tylenol , ibuprofen  and oxycodone  for postop pain Discussed postop recovery  Would prefer early May for scheduling procedure  - Ambulatory Referral For Surgery Scheduling  3. Suprapubic pain Note normal appearing pelvic ultrasound. If continue pain and menstrual irregularity, recommend EMB/D&C at time of tubal procedure.   4. Language barrier In person Spanish interpreter used for encounter     Carter Quarry, MD [facility] for [facility], [facility] "

## 2024-12-08 ENCOUNTER — Encounter (HOSPITAL_BASED_OUTPATIENT_CLINIC_OR_DEPARTMENT_OTHER): Payer: Self-pay

## 2024-12-08 ENCOUNTER — Other Ambulatory Visit: Payer: Self-pay

## 2024-12-08 ENCOUNTER — Emergency Department (HOSPITAL_BASED_OUTPATIENT_CLINIC_OR_DEPARTMENT_OTHER)
Admission: EM | Admit: 2024-12-08 | Discharge: 2024-12-08 | Disposition: A | Discharge status: Home / Self Care | Attending: Emergency Medicine | Admitting: Emergency Medicine

## 2024-12-08 DIAGNOSIS — R070 Pain in throat: Secondary | ICD-10-CM | POA: Diagnosis not present

## 2024-12-08 DIAGNOSIS — J029 Acute pharyngitis, unspecified: Secondary | ICD-10-CM | POA: Diagnosis present

## 2024-12-08 LAB — RESP PANEL BY RT-PCR (RSV, FLU A&B, COVID)  RVPGX2
Influenza A by PCR: NEGATIVE
Influenza B by PCR: NEGATIVE
Resp Syncytial Virus by PCR: NEGATIVE
SARS Coronavirus 2 by RT PCR: NEGATIVE

## 2024-12-08 LAB — GROUP A STREP BY PCR: Group A Strep by PCR: NOT DETECTED

## 2024-12-08 MED ORDER — IBUPROFEN 800 MG PO TABS
800.0000 mg | ORAL_TABLET | Freq: Once | ORAL | Status: AC
  Administered 2024-12-08: 800 mg via ORAL
  Filled 2024-12-08: qty 1

## 2024-12-08 MED ORDER — DEXAMETHASONE SOD PHOSPHATE PF 10 MG/ML IJ SOLN
10.0000 mg | Freq: Once | INTRAMUSCULAR | Status: AC
  Administered 2024-12-08: 10 mg via INTRAMUSCULAR
  Filled 2024-12-08: qty 1

## 2024-12-08 NOTE — ED Triage Notes (Addendum)
 Presents to ED with pain in throat and radiates into jaw for three days. Fever yesterday. Difficulty eating and drinking.

## 2024-12-08 NOTE — ED Provider Notes (Signed)
 " [place] EMERGENCY DEPARTMENT AT [facility] Provider Note   CSN: [id number] Arrival date & time: 12/08/24  1750     Patient presents with: Sore Throat   Tammy Rojas is a 39 y.o. female.    Sore Throat  39 year old female presenting today with a sore throat.  Patient reports that this been going on for about 3 days.  She reports that she has been having some difficulty swallowing and eating.  The majority of this is because of the pain.  Patient denies any chest pain or shortness of breath.  Patient denies any difficulty breathing.     Prior to Admission medications  Medication Sig Start Date End Date Taking? Authorizing Provider  acetaminophen -codeine  (TYLENOL  #3) 300-30 MG tablet Take 1-2 tablets by mouth every 6 (six) hours as needed for moderate pain (pain score 4-6). 01/13/24   Warren-Hill, Camie LABOR, CNM  ibuprofen  (ADVIL ) 800 MG tablet Take 1 tablet (800 mg total) by mouth 3 (three) times daily. Patient not taking: Reported on 12/01/2024 01/13/24   Regino Camie LABOR, CNM  misoprostol  (CYTOTEC ) 200 MCG tablet Place four tablets in between your gums and cheeks (two tablets on each side). Repeat in 48 hours. Patient not taking: Reported on 12/01/2024 01/13/24   Regino Camie LABOR, CNM  Prenatal Vit-Fe Fumarate-FA (PRENATAL VITAMINS PO) Take by mouth. Patient not taking: Reported on 12/01/2024    [provider]  promethazine  (PHENERGAN ) 12.5 MG tablet Take 1 tablet (12.5 mg total) by mouth every 4 (four) hours as needed for nausea or vomiting. Patient not taking: Reported on 12/01/2024 01/13/24   Regino Camie LABOR, CNM  Safety Seal Miscellaneous MISC 1 Application by Does not apply route daily. Medication name, Melaxemic Cream (Tranexamic Acid USP 5%, Kojic Acid USP 2%, Vit C USP 2.5%, Tretinoin USP 0.025%, Hydrocortisone USP 1%, Hyaluronic Acid EXCP 0.1%) Patient not taking: Reported on 12/01/2024 09/10/24   Alm Delon SAILOR, DO    Allergies: Patient  has no known allergies.    Review of Systems  All other systems reviewed and are negative.   Updated Vital Signs BP (!) 148/101 (BP Location: Right Arm)   Pulse 78   Temp 98.6 F (37 C) (Oral)   Resp 18   Ht 4' 10 (1.473 m)   Wt 65.7 kg   LMP 11/02/2024 (Exact Date)   SpO2 99%   BMI 30.27 kg/m   Physical Exam Vitals and nursing note reviewed.  HENT:     Head: Normocephalic.     Right Ear: Tympanic membrane and ear canal normal.     Left Ear: Tympanic membrane and ear canal normal.     Nose: Congestion present. No rhinorrhea.     Mouth/Throat:     Mouth: Mucous membranes are moist. No oral lesions.     Pharynx: Oropharynx is clear. Posterior oropharyngeal erythema present. No pharyngeal swelling, oropharyngeal exudate or uvula swelling.     Tonsils: No tonsillar exudate or tonsillar abscesses.  Eyes:     General: No scleral icterus. Pulmonary:     Effort: Pulmonary effort is normal.  Skin:    Coloration: Skin is not jaundiced.     Findings: No rash.  Neurological:     General: No focal deficit present.     Mental Status: She is alert.     (all labs ordered are listed, but only abnormal results are displayed) Labs Reviewed  GROUP A STREP BY PCR  RESP PANEL BY RT-PCR (RSV, FLU A&B, COVID)  [id number]    EKG: None  Radiology: No results found.   Procedures   Medications Ordered in the ED  dexamethasone  (DECADRON ) injection 10 mg (10 mg Intramuscular Given 12/08/24 2138)  ibuprofen  (ADVIL ) tablet 800 mg (800 mg Oral Given 12/08/24 2137)                                    Medical Decision Making Risk Prescription drug management.   Impression: 39 year old female presenting with throat pain.  Differential diagnosis include strep pharyngitis, COVID, flu, viral URI, peritonsillar abscess  Additional History: Patient was able to provide history.  I also reviewed other outpatient notes  Labs: Group A strep was negative.  Respiratory panel was  negative  Imaging: None  ED Course/Meds: 39 year old female presenting with throat pain.  Patient was well-appearing and in no acute distress.  Patient had no difficulty breathing however said she had some some pain and scratchiness whenever she was swallow or eat.  On physical exam there were no signs of peritonsillar abscess.  Uvula was midline.  There was some erythema noted to the back of the throat.  Tonsils were present.  No exudate noted.  Patient had some swelling noted to the right tonsil however it was nonobstructive.  Patient was given ibuprofen  and Decadron  to help with the swelling.  After this the patient reports she was feeling a little bit better.  Patient was educated on signs and symptoms of when to return to the ER.  Patient verbally agreed that she understood the plan stated above.  Patient was educated to follow-up with her primary care if the symptoms continued.  Patient was stable throughout her time in the ER and at discharge.      Final diagnoses:  None    ED Discharge Orders     None          Rosaline Almarie KANDICE DEVONNA 12/08/24 2222    Ellouise Fine K, [place] 12/08/24 2320  "

## 2024-12-08 NOTE — Discharge Instructions (Signed)
 You were seen in the emergency department today for throat pain.  I recommend that you continue to take Tylenol  and ibuprofen  as needed for pain.  Also recommend that you follow-up with your primary care if the symptoms continue.  If you start to develop any shortness of breath, chest pain, difficulty breathing, increase in swelling, increase in pain please return to the ER.

## 2024-12-09 ENCOUNTER — Telehealth: Payer: Self-pay

## 2024-12-09 NOTE — Transitions of Care (Post Inpatient/ED Visit) (Signed)
" ° °  12/09/2024  Name: Tammy Rojas MRN: [id number] DOB: 10-31-1986  Today's TOC FU Call Status: Today's TOC FU Call Status:: Unsuccessful Call (1st Attempt) Unsuccessful Call (1st Attempt) Date: 12/09/24  Attempted to reach the patient regarding the most recent Inpatient/ED visit.  Follow Up Plan: Additional outreach attempts will be made to reach the patient to complete the Transitions of Care (Post Inpatient/ED visit) call.   Signature Suzen Shove   CMA II  "

## 2024-12-10 ENCOUNTER — Other Ambulatory Visit: Payer: Self-pay

## 2024-12-10 ENCOUNTER — Emergency Department (HOSPITAL_COMMUNITY)
Admission: EM | Admit: 2024-12-10 | Discharge: 2024-12-11 | Discharge status: Against Medical Advice | Attending: Emergency Medicine | Admitting: Emergency Medicine

## 2024-12-10 ENCOUNTER — Telehealth: Payer: Self-pay

## 2024-12-10 ENCOUNTER — Encounter (HOSPITAL_COMMUNITY): Payer: Self-pay | Admitting: *Deleted

## 2024-12-10 DIAGNOSIS — R509 Fever, unspecified: Secondary | ICD-10-CM | POA: Insufficient documentation

## 2024-12-10 DIAGNOSIS — R0602 Shortness of breath: Secondary | ICD-10-CM | POA: Diagnosis present

## 2024-12-10 DIAGNOSIS — R519 Headache, unspecified: Secondary | ICD-10-CM | POA: Insufficient documentation

## 2024-12-10 DIAGNOSIS — R0981 Nasal congestion: Secondary | ICD-10-CM | POA: Diagnosis not present

## 2024-12-10 DIAGNOSIS — Z5321 Procedure and treatment not carried out due to patient leaving prior to being seen by health care provider: Secondary | ICD-10-CM | POA: Diagnosis not present

## 2024-12-10 LAB — RESP PANEL BY RT-PCR (RSV, FLU A&B, COVID)  RVPGX2
Influenza A by PCR: NEGATIVE
Influenza B by PCR: NEGATIVE
Resp Syncytial Virus by PCR: NEGATIVE
SARS Coronavirus 2 by RT PCR: NEGATIVE

## 2024-12-10 LAB — GROUP A STREP BY PCR: Group A Strep by PCR: NOT DETECTED

## 2024-12-10 LAB — MONONUCLEOSIS SCREEN: Mono Screen: NEGATIVE

## 2024-12-10 MED ORDER — ACETAMINOPHEN 325 MG PO TABS
650.0000 mg | ORAL_TABLET | Freq: Once | ORAL | Status: DC | PRN
  Filled 2024-12-10: qty 2

## 2024-12-10 MED ORDER — IBUPROFEN 800 MG PO TABS
800.0000 mg | ORAL_TABLET | Freq: Once | ORAL | Status: AC
  Administered 2024-12-10: 800 mg via ORAL
  Filled 2024-12-10: qty 1

## 2024-12-10 NOTE — ED Provider Triage Note (Signed)
 Emergency Medicine Provider Triage Evaluation Note  Tammy Rojas , a 39 y.o. female  was evaluated in triage.  Pt complains of sore throat, nasal congestion, fever, general malaise over the last 4 days.  Progressively worsened over that time.  States that she was seen at urgent care and given some IM corticosteroids, has not had a significant improvement with over-the-counter acetaminophen  or ibuprofen , and is not a significant improvement after administration of corticosteroid.  Significant nasal congestion.  Review of Systems  Positive: As above Negative:   Physical Exam  BP 130/84   Pulse (!) 125   Temp (!) 101 F (38.3 C)   Resp (!) 24   LMP 11/02/2024 (Exact Date)   SpO2 99%  Gen:   Awake, no distress   Resp:  Normal effort  MSK:   Moves extremities without difficulty  Other:  +1 tonsils bilaterally, no exudate appreciated.  Posterior oropharynx is erythematous otherwise clear.  Medical Decision Making  Medically screening exam initiated at 9:34 PM.  Appropriate orders placed.  Tammy Rojas was informed that the remainder of the evaluation will be completed by another provider, this initial triage assessment does not replace that evaluation, and the importance of remaining in the ED until their evaluation is complete.  Initial screening labs ordered.   Myriam Dorn BROCKS, [place] 12/10/24 2136

## 2024-12-10 NOTE — Transitions of Care (Post Inpatient/ED Visit) (Cosign Needed)
" ° °  12/10/2024  Name: Tammy Rojas MRN: [id number] DOB: 11-29-1985  Today's TOC FU Call Status: Today's TOC FU Call Status:: Successful TOC FU Call Completed TOC FU Call Complete Date: 12/10/24  Patient's Name and Date of Birth confirmed. Name, DOB  Transition Care Management Follow-up Telephone Call Date of Discharge: 12/08/24 Discharge Facility: [facility] ([facility] Type of Discharge: Emergency Department Reason for ED Visit: Respiratory How have you been since you were released from the hospital?: Same Any questions or concerns?: No  Items Reviewed: Did you receive and understand the discharge instructions provided?: Yes Medications obtained,verified, and reconciled?: Yes (Medications Reviewed) Any new allergies since your discharge?: No Dietary orders reviewed?: NA Do you have support at home?: Yes People in Home [RPT]: child(ren), dependent  Medications Reviewed Today: Medications Reviewed Today     Reviewed by Victory Iha, RMA (Registered Medical Assistant) on 12/10/24 at 1616  Med List Status: <None>   Medication Order Taking? Sig Documenting Provider Last Dose Status Informant  acetaminophen -codeine  (TYLENOL  #3) 300-30 MG tablet [id number]  Take 1-2 tablets by mouth every 6 (six) hours as needed for moderate pain (pain score 4-6).  Patient not taking: Reported on 12/10/2024   Regino Camie LABOR, CNM  Active   ibuprofen  (ADVIL ) 800 MG tablet [id number] Yes Take 1 tablet (800 mg total) by mouth 3 (three) times daily. Warren-Hill, Camie LABOR, CNM  Active   misoprostol  (CYTOTEC ) 200 MCG tablet [id number]  Place four tablets in between your gums and cheeks (two tablets on each side). Repeat in 48 hours.  Patient not taking: Reported on 12/10/2024   Regino Camie LABOR, CNM  Active   Prenatal Vit-Fe Fumarate-FA (PRENATAL VITAMINS PO) [id number]  Take by mouth.  Patient not taking: Reported on 12/10/2024   [provider]  Active   promethazine  (PHENERGAN )  12.5 MG tablet [id number]  Take 1 tablet (12.5 mg total) by mouth every 4 (four) hours as needed for nausea or vomiting.  Patient not taking: Reported on 12/10/2024   Regino Camie LABOR, CNM  Active   Safety Seal Miscellaneous MISC [id number]  1 Application by Does not apply route daily. Medication name, Melaxemic Cream (Tranexamic Acid USP 5%, Kojic Acid USP 2%, Vit C USP 2.5%, Tretinoin USP 0.025%, Hydrocortisone USP 1%, Hyaluronic Acid EXCP 0.1%)  Patient not taking: Reported on 12/10/2024   Alm Delon SAILOR, DO  Active             Home Care and Equipment/Supplies: Were Home Health Services Ordered?: NA Any new equipment or medical supplies ordered?: NA  Functional Questionnaire: Do you need assistance with bathing/showering or dressing?: No Do you need assistance with meal preparation?: No Do you need assistance with eating?: No Do you have difficulty maintaining continence: No Do you need assistance with getting out of bed/getting out of a chair/moving?: No Do you have difficulty managing or taking your medications?: No  Follow up appointments reviewed: PCP Follow-up appointment confirmed?: Yes Date of PCP follow-up appointment?: 12/11/24 Follow-up Provider: Bascom Borer Specialist [facility] Follow-up appointment confirmed?: NA Do you need transportation to your follow-up appointment?: No Do you understand care options if your condition(s) worsen?: Yes-patient verbalized understanding  SDOH Interventions Today    Flowsheet Row Most Recent Value  SDOH Interventions   Food Insecurity Interventions Intervention Not Indicated  Housing Interventions Intervention Not Indicated  Transportation Interventions Intervention Not Indicated  Utilities Interventions Intervention Not Indicated    SIGNATURE Iha Victory   CMA II  "

## 2024-12-10 NOTE — ED Triage Notes (Addendum)
 Pt arrives POV with complaints of pain in throat that radiates to bil jaws for 4 days along with a headache and a fever. States that she feels her throat is swelling and that she can't breathe well. Pt was seen at [facility] today and was sent to ED for a workup but was also seen two days ago for same but states her sx have worsened. Pt does have a fever of 101 on arrival to ED

## 2024-12-10 NOTE — ED Triage Notes (Addendum)
 Pt arrived with c/o sore throat, jaw pain, and sob x 4 days that has progressively gotten worse. Reports husband had been sick as well but not as severe. Pt tachypneic, lung sounds diminished.  Pt took 'two tylenol ' ~ 2hours ago, believed to be 500mg . Last took motrin  this morning

## 2024-12-11 ENCOUNTER — Ambulatory Visit (INDEPENDENT_AMBULATORY_CARE_PROVIDER_SITE_OTHER): Payer: Self-pay | Admitting: Nurse Practitioner

## 2024-12-11 ENCOUNTER — Encounter: Payer: Self-pay | Admitting: Nurse Practitioner

## 2024-12-11 VITALS — BP 133/85 | HR 99 | Temp 98.1°F | Wt 143.0 lb

## 2024-12-11 DIAGNOSIS — J069 Acute upper respiratory infection, unspecified: Secondary | ICD-10-CM

## 2024-12-11 MED ORDER — AZITHROMYCIN 250 MG PO TABS: ORAL_TABLET | ORAL | 0 refills | Status: AC

## 2024-12-11 MED ORDER — PREDNISONE 20 MG PO TABS: 20.0000 mg | ORAL_TABLET | Freq: Every day | ORAL | 0 refills | Status: AC

## 2024-12-11 NOTE — Progress Notes (Signed)
 "  Subjective   Patient ID: Tammy Rojas, female    DOB: [birth year], 39 y.o.   MRN: [id number]  Chief Complaint  Patient presents with   Hospitalization Follow-up    Head and throat been hurting for past 3 days. Went to ER yesterday due to having trouble with breathing. Feeling very cold today.     Referring provider: Paseda, Folashade R, FNP  Tammy Rojas is a 39 y.o. female with Past Medical History: No date: Fibroid     Comment:  on [place]  Feb 2025 No date: No pertinent past medical history   HPI  Patient presents today for a sick visit.  She has been seen in the ED twice for flulike symptoms.  Patient has been having sore throat, shortness of breath, cough, headache for several days now.  Flu and COVID test were negative.  Will trial azithromycin  and prednisone . Denies f/c/s, n/v/d, hemoptysis, PND, leg swelling Denies chest pain or edema        Allergies[1]  Immunization History  Administered Date(s) Administered   Influenza Split 07/31/2012   Influenza, Seasonal, Injecte, Preservative Fre 07/31/2023   Tdap 07/31/2012    Tobacco History: Tobacco Use History[2] Counseling given: Not Answered   Outpatient Encounter Medications as of 12/11/2024  Medication Sig   acetaminophen -codeine  (TYLENOL  #3) 300-30 MG tablet Take 1-2 tablets by mouth every 6 (six) hours as needed for moderate pain (pain score 4-6).   azithromycin  (ZITHROMAX ) 250 MG tablet Take 2 tablets on day 1, then 1 tablet daily on days 2 through 5   ibuprofen  (ADVIL ) 800 MG tablet Take 1 tablet (800 mg total) by mouth 3 (three) times daily.   predniSONE  (DELTASONE ) 20 MG tablet Take 1 tablet (20 mg total) by mouth daily with breakfast.   Safety Seal Miscellaneous MISC 1 Application by Does not apply route daily. Medication name, Melaxemic Cream (Tranexamic Acid USP 5%, Kojic Acid USP 2%, Vit C USP 2.5%, Tretinoin USP 0.025%, Hydrocortisone USP 1%, Hyaluronic Acid EXCP 0.1%)   misoprostol   (CYTOTEC ) 200 MCG tablet Place four tablets in between your gums and cheeks (two tablets on each side). Repeat in 48 hours. (Patient not taking: Reported on 12/11/2024)   Prenatal Vit-Fe Fumarate-FA (PRENATAL VITAMINS PO) Take by mouth. (Patient not taking: Reported on 12/11/2024)   promethazine  (PHENERGAN ) 12.5 MG tablet Take 1 tablet (12.5 mg total) by mouth every 4 (four) hours as needed for nausea or vomiting. (Patient not taking: Reported on 12/11/2024)   No facility-administered encounter medications on file as of 12/11/2024.    Review of Systems  Review of Systems  Constitutional: Negative.   HENT:  Positive for congestion, postnasal drip, sinus pressure, sinus pain and sore throat.   Respiratory:  Positive for cough and shortness of breath.   Cardiovascular: Negative.   Gastrointestinal: Negative.   Allergic/Immunologic: Negative.   Neurological: Negative.   Psychiatric/Behavioral: Negative.       Objective:   BP 133/85   Pulse 99   Temp 98.1 F (36.7 C) (Temporal)   Wt 143 lb (64.9 kg)   LMP 11/02/2024 (Exact Date)   SpO2 98%   BMI 29.89 kg/m   Wt Readings from Last 5 Encounters:  12/11/24 143 lb (64.9 kg)  12/08/24 144 lb 13.5 oz (65.7 kg)  12/01/24 145 lb (65.8 kg)  10/12/24 142 lb (64.4 kg)  01/22/24 147 lb (66.7 kg)     Physical Exam Vitals and nursing note reviewed.  Constitutional:      General:  She is not in acute distress.    Appearance: She is well-developed.  HENT:     Nose: Congestion present.  Cardiovascular:     Rate and Rhythm: Normal rate and regular rhythm.  Pulmonary:     Effort: Pulmonary effort is normal.     Breath sounds: Normal breath sounds.  Neurological:     Mental Status: She is alert and oriented to person, place, and time.       Assessment & Plan:   Upper respiratory tract infection, unspecified type -     predniSONE ; Take 1 tablet (20 mg total) by mouth daily with breakfast.  Dispense: 5 tablet; Refill: 0 -      Azithromycin ; Take 2 tablets on day 1, then 1 tablet daily on days 2 through 5  Dispense: 6 tablet; Refill: 0     Return if symptoms worsen or fail to improve.   Bascom GORMAN Borer, NP 12/11/2024     [1] No Known Allergies [2]  Social History Tobacco Use  Smoking Status Never  Smokeless Tobacco Never   "

## 2024-12-11 NOTE — ED Notes (Signed)
Pt stated she was leaving.  

## 2024-12-15 ENCOUNTER — Telehealth: Payer: Self-pay

## 2024-12-15 NOTE — Transitions of Care (Post Inpatient/ED Visit) (Cosign Needed)
 "  12/15/2024  Name: Tammy Rojas MRN: [id number] DOB: 1986-05-24  Today's TOC FU Call Status: Today's TOC FU Call Status:: Successful TOC FU Call Completed TOC FU Call Complete Date: 12/15/24  Patient's Name and Date of Birth confirmed. Name, DOB  Transition Care Management Follow-up Telephone Call Date of Discharge: 12/11/24 Discharge Facility: [facility]) Type of Discharge: Emergency Department Reason for ED Visit: Respiratory How have you been since you were released from the hospital?: Better  Items Reviewed: Did you receive and understand the discharge instructions provided?: No (pt left) Any new allergies since your discharge?: No Dietary orders reviewed?: NA Do you have support at home?: No  Medications Reviewed Today: Medications Reviewed Today     Reviewed by Victory Iha, RMA (Registered Medical Assistant) on 12/15/24 at 1014  Med List Status: <None>   Medication Order Taking? Sig Documenting Provider Last Dose Status Informant  acetaminophen -codeine  (TYLENOL  #3) 300-30 MG tablet [id number] Yes Take 1-2 tablets by mouth every 6 (six) hours as needed for moderate pain (pain score 4-6). Warren-Hill, Camie LABOR, CNM  Active   azithromycin  (ZITHROMAX ) 250 MG tablet [id number] Yes Take 2 tablets on day 1, then 1 tablet daily on days 2 through 5 Nichols, Tonya S, NP  Active   ibuprofen  (ADVIL ) 800 MG tablet [id number] Yes Take 1 tablet (800 mg total) by mouth 3 (three) times daily. Warren-Hill, Camie LABOR, CNM  Active   misoprostol  (CYTOTEC ) 200 MCG tablet [id number]  Place four tablets in between your gums and cheeks (two tablets on each side). Repeat in 48 hours.  Patient not taking: Reported on 12/11/2024   Regino Camie LABOR, CNM  Active   predniSONE  (DELTASONE ) 20 MG tablet [id number] Yes Take 1 tablet (20 mg total) by mouth daily with breakfast. Oley Bascom RAMAN, NP  Active   Prenatal Vit-Fe Fumarate-FA (PRENATAL VITAMINS PO) [id number]  Take by mouth.  Patient not  taking: Reported on 12/11/2024   [provider]  Active   promethazine  (PHENERGAN ) 12.5 MG tablet [id number]  Take 1 tablet (12.5 mg total) by mouth every 4 (four) hours as needed for nausea or vomiting.  Patient not taking: Reported on 12/11/2024   Regino Camie LABOR, CNM  Active   Safety Seal Miscellaneous MISC [id number] Yes 1 Application by Does not apply route daily. Medication name, Melaxemic Cream (Tranexamic Acid USP 5%, Kojic Acid USP 2%, Vit C USP 2.5%, Tretinoin USP 0.025%, Hydrocortisone USP 1%, Hyaluronic Acid EXCP 0.1%) Alm Delon SAILOR, DO  Active             Home Care and Equipment/Supplies: Were Home Health Services Ordered?: NA Any new equipment or medical supplies ordered?: NA  Functional Questionnaire: Do you need assistance with bathing/showering or dressing?: No Do you need assistance with meal preparation?: No Do you need assistance with eating?: No Do you have difficulty maintaining continence: No Do you need assistance with getting out of bed/getting out of a chair/moving?: No Do you have difficulty managing or taking your medications?: No  Follow up appointments reviewed: PCP Follow-up appointment confirmed?: Yes Date of PCP follow-up appointment?: 12/28/24 Follow-up Provider: Folashade [facility]aseda [facility] Follow-up appointment confirmed?: NA Do you need transportation to your follow-up appointment?: No Do you understand care options if your condition(s) worsen?: Yes-patient verbalized understanding  SDOH Interventions Today    Flowsheet Row Most Recent Value  SDOH Interventions   Food Insecurity Interventions Intervention Not Indicated  Housing Interventions Intervention Not Indicated  Transportation Interventions Intervention Not Indicated  Utilities Interventions Intervention Not Indicated    SIGNATURE.  Suzen Shove   CMA II  "

## 2024-12-28 ENCOUNTER — Ambulatory Visit: Payer: Self-pay | Admitting: Nurse Practitioner

## 2025-01-20 ENCOUNTER — Ambulatory Visit: Payer: Self-pay | Admitting: Nurse Practitioner

## 2025-03-17 ENCOUNTER — Ambulatory Visit: Admitting: Dermatology
